# Patient Record
Sex: Female | Born: 1970 | Race: Black or African American | Hispanic: No | Marital: Married | Smoking: Never smoker
Health system: Southern US, Community
[De-identification: ages and names within clinical notes are randomized; demographics above are authoritative.]

## PROBLEM LIST (undated history)

## (undated) DIAGNOSIS — N200 Calculus of kidney: Secondary | ICD-10-CM

## (undated) DIAGNOSIS — D219 Benign neoplasm of connective and other soft tissue, unspecified: Secondary | ICD-10-CM

## (undated) HISTORY — DX: Calculus of kidney: N20.0

## (undated) HISTORY — DX: Benign neoplasm of connective and other soft tissue, unspecified: D21.9

---

## 1994-05-28 HISTORY — PX: APPENDECTOMY: SHX54

## 2002-05-28 HISTORY — PX: MYOMECTOMY ABDOMINAL APPROACH: SUR870

## 2014-11-01 ENCOUNTER — Ambulatory Visit (INDEPENDENT_AMBULATORY_CARE_PROVIDER_SITE_OTHER): Payer: PRIVATE HEALTH INSURANCE | Admitting: Family Medicine

## 2014-11-01 ENCOUNTER — Encounter: Payer: Self-pay | Admitting: Family Medicine

## 2014-11-01 VITALS — BP 128/70 | HR 76 | Temp 98.1°F | Ht 63.0 in | Wt 159.0 lb

## 2014-11-01 DIAGNOSIS — D259 Leiomyoma of uterus, unspecified: Secondary | ICD-10-CM | POA: Diagnosis not present

## 2014-11-01 DIAGNOSIS — K219 Gastro-esophageal reflux disease without esophagitis: Secondary | ICD-10-CM

## 2014-11-01 DIAGNOSIS — M1711 Unilateral primary osteoarthritis, right knee: Secondary | ICD-10-CM | POA: Insufficient documentation

## 2014-11-01 MED ORDER — RANITIDINE HCL 150 MG PO TABS: 150.0000 mg | ORAL_TABLET | Freq: Two times a day (BID) | ORAL | Status: DC | PRN

## 2014-11-01 NOTE — Patient Instructions (Signed)
Nice to meet you today. I put in a referral to gynecology for your fibroids. Someone will call you about this appointment when it is available.  For your belly pain, I think this is related to reflux. I sent a prescription for Zantac to your pharmacy. You can use this twice a day when necessary for heartburn. If it is not getting better please let me know me can have you take it more often.  Walk more often and take breaks on long car trips for your knee pain.  Try tylenol first for pain, but taking motrin every once in a while wont hurt.  Come back to see me for a wellness visit and pap smear when it is convenient for you.  Take care, Dr. Jacinto Reap

## 2014-11-01 NOTE — Progress Notes (Signed)
   Subjective:   Whitney Johns is a 44 y.o. female with a history of GERD, uterine fibroids s/p myomectomy here for establishing care.  Epigastric pain:  - Patient reports intermittent epigastric pain - like a burning sensation - occasionally radiates up her esophagus or to her back - cannot distinguish a pattern of exacerbating factors - reports that she was told that she may have ulcers when she was younger in [place], but it was not looked at, just started on antacid - Reports that last year she had 2 wk course of omeprazole that helped pain - denies N/V/melena/BRBPR  S/p myomectomy, fibroids - said fibroids had grown again when she was pregnant in 2010 - wasn't able to get pregnant after that child - periods are heavy, feels weak and getting heavier - periods are irregular - cycles vary from 24 to 32 day cycle, but used to be regular - would like a referral to Gyn to discuss options  R knee pain:  - reports intermittent R knee pain and stiffness when driving longer distances - walking helps relieve symptoms - this has been present intermittently for many years  Plans to make f/u appt for wellness visit and pap smear  Review of Systems:  Per HPI. All other systems reviewed and are negative.   PMH, PSH, Medications, Allergies, and FmHx reviewed and updated in EMR.  Social History: never smoker  Objective:  BP 128/70 mmHg  Pulse 76  Temp(Src) 98.1 F (36.7 C) (Oral)  Ht 5\' 3"  (1.6 m)  Wt 159 lb (72.122 kg)  BMI 28.17 kg/m2  Gen:  44 y.o. female in NAD HEENT: NCAT, MMM, EOMI, PERRL, anicteric sclerae CV: RRR, no MRG Resp: Non-labored, CTAB, no wheezes noted Abd: Soft, NTND, BS present, no guarding or organomegaly, can palpate fibroids in lower abd (~15 week uterus size) Ext: WWP, no edema MSK: R knee: Full ROM, strength intact, no TTP, mild crepitus Neuro: Alert and oriented, speech normal   Assessment:     Whitney Johns is a 44 y.o. female here for  establishing care, GERD, fibroids, R knee pain.    Plan:     See problem list for problem-specific plans.   Whitney Crews, MD PGY-1,  [facility] 11/01/2014  4:07 PM

## 2014-11-01 NOTE — Assessment & Plan Note (Addendum)
Constellation of symptoms sounds like GERD  prescription given for Zantac 150 mg twice a day when necessary  For heartburn  if this is not consistently help can consider scheduled twice daily Zantac or PPI therapy   if worsening consider H. Pylori testing or GI referral

## 2014-11-01 NOTE — Assessment & Plan Note (Signed)
Will refer to gynecology for discussion of options Advised patient that she may need imaging to further evaluate fibroids as it'll be difficult to get imaging from [place]  Will leave decision about imaging up to gynecology

## 2014-11-01 NOTE — Assessment & Plan Note (Signed)
Suspect knee pain and stiffness when held in one place for a long time w crepitus on exam is consistent with mild osteoarthritis of the right knee  advised patient  To take breaks when she is driving long distances and walk around to relieve the stiffness and pain  Tylenol as needed for pain  Try not to take a lot of NSAIDs given GERD symptoms

## 2014-11-02 ENCOUNTER — Encounter: Payer: Self-pay | Admitting: Obstetrics & Gynecology

## 2014-11-26 ENCOUNTER — Ambulatory Visit (INDEPENDENT_AMBULATORY_CARE_PROVIDER_SITE_OTHER): Payer: PRIVATE HEALTH INSURANCE | Admitting: Obstetrics & Gynecology

## 2014-11-26 ENCOUNTER — Encounter: Payer: Self-pay | Admitting: Obstetrics & Gynecology

## 2014-11-26 VITALS — BP 118/70 | HR 74 | Temp 99.0°F | Ht 63.0 in | Wt 156.9 lb

## 2014-11-26 DIAGNOSIS — Z1239 Encounter for other screening for malignant neoplasm of breast: Secondary | ICD-10-CM

## 2014-11-26 DIAGNOSIS — N921 Excessive and frequent menstruation with irregular cycle: Secondary | ICD-10-CM | POA: Diagnosis not present

## 2014-11-26 DIAGNOSIS — D259 Leiomyoma of uterus, unspecified: Secondary | ICD-10-CM | POA: Diagnosis not present

## 2014-11-26 DIAGNOSIS — Z01419 Encounter for gynecological examination (general) (routine) without abnormal findings: Secondary | ICD-10-CM

## 2014-11-26 DIAGNOSIS — Z3202 Encounter for pregnancy test, result negative: Secondary | ICD-10-CM

## 2014-11-26 DIAGNOSIS — Z1151 Encounter for screening for human papillomavirus (HPV): Secondary | ICD-10-CM | POA: Diagnosis not present

## 2014-11-26 DIAGNOSIS — Z124 Encounter for screening for malignant neoplasm of cervix: Secondary | ICD-10-CM | POA: Diagnosis not present

## 2014-11-26 LAB — CBC
HCT: 34.6 % — ABNORMAL LOW (ref 36.0–46.0)
HEMOGLOBIN: 10.9 g/dL — AB (ref 12.0–15.0)
MCH: 22.7 pg — AB (ref 26.0–34.0)
MCHC: 31.5 g/dL (ref 30.0–36.0)
MCV: 71.9 fL — ABNORMAL LOW (ref 78.0–100.0)
MPV: 9.1 fL (ref 8.6–12.4)
Platelets: 320 10*3/uL (ref 150–400)
RBC: 4.81 MIL/uL (ref 3.87–5.11)
RDW: 16.2 % — AB (ref 11.5–15.5)
WBC: 4.9 10*3/uL (ref 4.0–10.5)

## 2014-11-26 LAB — POCT PREGNANCY, URINE: PREG TEST UR: NEGATIVE

## 2014-11-26 MED ORDER — EST ESTROGENS-METHYLTEST 0.625-1.25 MG PO TABS: 1.0000 | ORAL_TABLET | Freq: Every day | ORAL | Status: DC

## 2014-11-26 MED ORDER — MEDROXYPROGESTERONE ACETATE 10 MG PO TABS: 20.0000 mg | ORAL_TABLET | Freq: Every day | ORAL | Status: DC

## 2014-11-26 NOTE — Progress Notes (Signed)
C/o periods now heavy for more than a year , and she feels weak.

## 2014-11-26 NOTE — Progress Notes (Signed)
Patient ID: Whitney Johns, female   DOB: April 09, 1971, 44 y.o.   MRN: [id number]  Chief Complaint  Patient presents with  . Fibroids  C/o periods now heavy for more than a year , and she feels weak.   HPI Whitney Johns is a 44 y.o. female.  [id number] Patient's last menstrual period was 11/06/2014. H/O fibroid and myomectomy 12 years ago in [place], [place] 2010 in [place]. Increasing menometrorrhagia  HPI  Past Medical History  Diagnosis Date  . Fibroids     Past Surgical History  Procedure Laterality Date  . Appendectomy  1996  . Cesarean section  2010  . Myomectomy abdominal approach  2004    Family History  Problem Relation Age of Onset  . Cancer Father     prostate  . Cancer Paternal Grandfather     trachea    Social History History  Substance Use Topics  . Smoking status: Never Smoker   . Smokeless tobacco: Never Used  . Alcohol Use: No     Comment: once per month (1 glass of wine)    No Known Allergies  Current Outpatient Prescriptions  Medication Sig Dispense Refill  . ranitidine (ZANTAC) 150 MG tablet Take 1 tablet (150 mg total) by mouth 2 (two) times daily as needed for heartburn. 60 tablet 2  . medroxyPROGESTERone (PROVERA) 10 MG tablet Take 2 tablets (20 mg total) by mouth daily. 30 tablet 2   No current facility-administered medications for this visit.    Review of Systems Review of Systems  Constitutional: Positive for fatigue.  Respiratory: Negative.   Cardiovascular: Negative.   Gastrointestinal: Negative.   Genitourinary: Positive for menstrual problem. Negative for vaginal bleeding.    Blood pressure 118/70, pulse 74, temperature 99 F (37.2 C), height 5\' 3"  (1.6 m), weight 156 lb 14.4 oz (71.169 kg), last menstrual period 11/06/2014.  Physical Exam Physical Exam  Constitutional: She is oriented to person, place, and time. She appears well-developed. No distress.  Pulmonary/Chest: Effort normal. No respiratory distress.  Abdominal: Soft. She  exhibits no mass.  Genitourinary: Vagina normal. No vaginal discharge found.  Cervix nl. Pap done, uterus 10 week size firm no masses  Neurological: She is alert and oriented to person, place, and time.  Skin: Skin is warm and dry. No pallor.  Psychiatric: She has a normal mood and affect. Her behavior is normal.    Data Reviewed Office note [place]  Assessment    Menometrorrhagia Fibroid uterus  Discussed fertility and likelihood of pregnancy     Plan    US pelvis ordered Pap done today Provera 20 mg daily CBC RTC 4 weeks        Jabbar Palmero 11/26/2014, 9:54 AM

## 2014-11-26 NOTE — Patient Instructions (Addendum)

## 2014-11-30 LAB — CYTOLOGY - PAP

## 2014-12-03 ENCOUNTER — Ambulatory Visit (HOSPITAL_COMMUNITY): Payer: PRIVATE HEALTH INSURANCE

## 2014-12-10 ENCOUNTER — Ambulatory Visit (HOSPITAL_COMMUNITY)
Admission: RE | Admit: 2014-12-10 | Discharge: 2014-12-10 | Disposition: A | Discharge status: Home / Self Care | Payer: PRIVATE HEALTH INSURANCE | Source: Ambulatory Visit | Attending: Obstetrics & Gynecology | Admitting: Obstetrics & Gynecology

## 2014-12-10 DIAGNOSIS — N852 Hypertrophy of uterus: Secondary | ICD-10-CM | POA: Diagnosis not present

## 2014-12-10 DIAGNOSIS — N921 Excessive and frequent menstruation with irregular cycle: Secondary | ICD-10-CM | POA: Diagnosis not present

## 2014-12-10 DIAGNOSIS — D259 Leiomyoma of uterus, unspecified: Secondary | ICD-10-CM

## 2014-12-10 DIAGNOSIS — Z1231 Encounter for screening mammogram for malignant neoplasm of breast: Secondary | ICD-10-CM | POA: Insufficient documentation

## 2014-12-10 DIAGNOSIS — Z1239 Encounter for other screening for malignant neoplasm of breast: Secondary | ICD-10-CM

## 2014-12-24 ENCOUNTER — Ambulatory Visit (INDEPENDENT_AMBULATORY_CARE_PROVIDER_SITE_OTHER): Payer: PRIVATE HEALTH INSURANCE | Admitting: Obstetrics & Gynecology

## 2014-12-24 ENCOUNTER — Encounter: Payer: Self-pay | Admitting: Obstetrics & Gynecology

## 2014-12-24 VITALS — BP 132/78 | HR 72 | Temp 98.0°F | Ht 63.0 in | Wt 158.6 lb

## 2014-12-24 DIAGNOSIS — D259 Leiomyoma of uterus, unspecified: Secondary | ICD-10-CM | POA: Diagnosis not present

## 2014-12-24 NOTE — Patient Instructions (Signed)

## 2014-12-24 NOTE — Progress Notes (Signed)
Subjective:     Patient ID: Whitney Johns, female   DOB: [birth year], 44 y.o.   MRN: [id number]  HPI Whitney Johns is a 44 y.o. AA female G2P1011 who presents to the clinic for a follow up of pelvic ultrasound for uterine fibroids. She has had no changes since last visit 11/26/2014. LMP 12/01/2014. Periods are heavy, especially in the first couple days. Changes tampons q2h and pads q3h. Has occasional 6/10 crampy intermittent pelvic pain with periods. Patient still desires future pregnancy and would like to discuss options.   Ultrasound Report: FINDINGS: Uterus measurements: At least 16.5 x 9.3 x 10.4 cm. Multiple fibroids are identified and difficult to differentiate. However, following measurements are made:  Left partially calcified sub serosal fibroid 5.3 x 5.0 x 4.2 cm.  Right fundal subserosal fibroid 8.3 x 7.6 x 7.4 cm.  Right sub serosal uterine fibroid 4.6 x 4.4 x 3.8 cm.  Endometrium Thickness: Difficult to visualize but estimated to be 2.1 mm. Upper uterine endometrium is obscured or displaced by fibroids. No focal abnormality visualized.  Right ovary measurements: 3.1 x 2.8 x 1.9 cm. Normal appearance/no adnexal mass.  Left ovary measurements: The ovary is not visualized, either absent or obscured. No adnexal masses identified.  Other findings: No free fluid.  IMPRESSION: 1. Enlarged uterus containing numerous fibroids as described. 2. The visualized portion of the endometrium is normal in appearance. If bleeding remains unresponsive to hormonal or medical therapy, sonohysterogram should be considered for focal lesion work-up. (Ref: Radiological Reasoning: Algorithmic Workup of Abnormal Vaginal Bleeding with Endovaginal Sonography and Sonohysterography. AJR 2008; 086:P61-95)  Review of Systems  Constitutional: Positive for fatigue (especially when walking up stairs, improves with implementing more exercise throughout the day). Negative for fever, chills and  unexpected weight change.  HENT: Negative for congestion.   Eyes: Negative for visual disturbance.  Respiratory: Negative for apnea, cough, chest tightness and shortness of breath.   Cardiovascular: Negative for chest pain, palpitations and leg swelling.  Gastrointestinal: Negative for nausea, vomiting, diarrhea, constipation and blood in stool. Abdominal pain: mild crampy pelvic pain.  Endocrine: Negative for polydipsia and polyuria.  Genitourinary: Positive for menstrual problem (heavy periods) and pelvic pain (mild crampy pelvic pain ). Negative for dysuria, urgency, frequency, hematuria, flank pain, decreased urine volume, vaginal bleeding, vaginal discharge, enuresis, difficulty urinating, vaginal pain and dyspareunia.  Musculoskeletal: Negative for myalgias and back pain.  Skin: Negative for pallor and rash.  Neurological: Positive for headaches (occasionally, relieved with tylenol). Negative for dizziness, syncope and weakness.  Hematological: Does not bruise/bleed easily.  Psychiatric/Behavioral: Positive for agitation (occasionally gets irritated).       Objective:   Physical Exam  Constitutional: She is oriented to person, place, and time. She appears well-developed and well-nourished. No distress.  Eyes: Conjunctivae are normal. No scleral icterus.  Neck: Neck supple.  Cardiovascular: Normal rate, regular rhythm and normal heart sounds.   Pulmonary/Chest: Effort normal and breath sounds normal.  Abdominal: Soft. Bowel sounds are normal. She exhibits no distension. There is tenderness (mild tenderness in lower quadrants).  Musculoskeletal: She exhibits no edema or tenderness.  Lymphadenopathy:    She has no cervical adenopathy.  Neurological: She is alert and oriented to person, place, and time.  Skin: Skin is warm and dry. No rash noted. She is not diaphoretic. No erythema. No pallor.  Psychiatric: She has a normal mood and affect.       Assessment:     Fibroid uterus,  recurrent  Menometrorrhagia Discussed fertility  and likelihood of pregnancy    Plan:     Fibroids are stable for now. CBC stable- Hg was 10.9. Surgical removal is not recommended due to recurrence of fibroids after past surgical removal and risk of hysterectomy as patient desires pregnancy.  Referred to [facility] for discussion of possible fertility options  RTC prn

## 2015-02-03 ENCOUNTER — Other Ambulatory Visit: Payer: Self-pay | Admitting: Family Medicine

## 2017-02-12 IMAGING — US US PELVIS COMPLETE
1 series · 13 of 25 positions shown · non-contrast
Comparison: None

CLINICAL DATA: Menometrorrhagia. History of myomectomy and
C-section. LMP 12/01/2014.

EXAM:
TRANSABDOMINAL AND TRANSVAGINAL ULTRASOUND OF PELVIS
TECHNIQUE: Both transabdominal and transvaginal ultrasound examinations of the
pelvis were performed. Transabdominal technique was performed for
global imaging of the pelvis including uterus, ovaries, adnexal
regions, and pelvic cul-de-sac. It was necessary to proceed with
endovaginal exam following the transabdominal exam to visualize the
endometrium and ovaries.

[Series 1: us non-ob tv/pel · 13 of 40 slices shown]
[im 1/40]
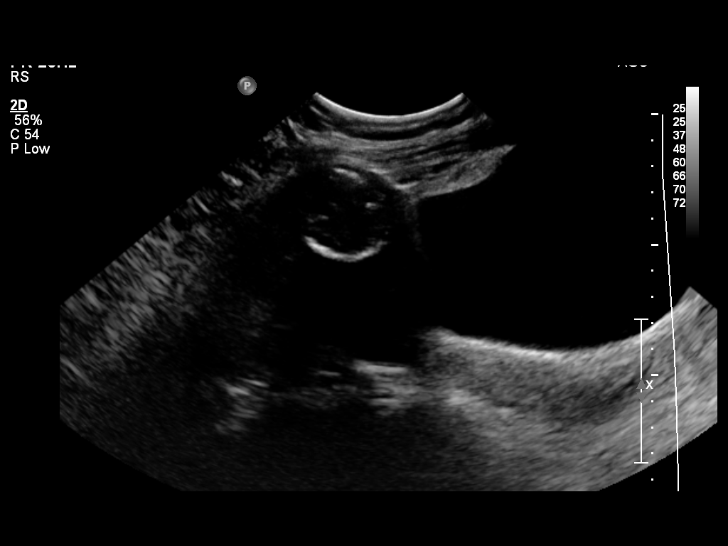
[im 4/40]
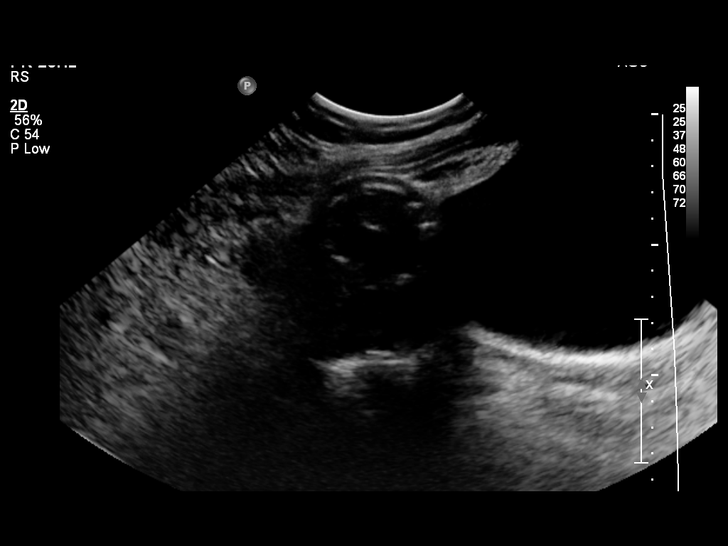
[im 7/40]
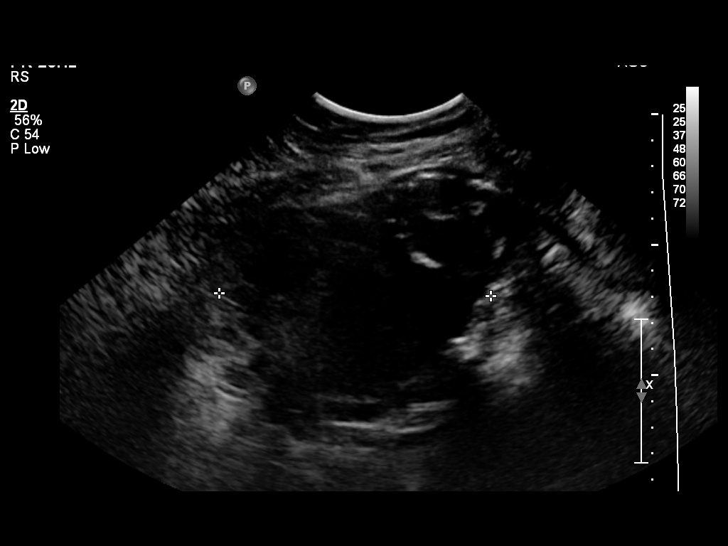
[im 10/40]
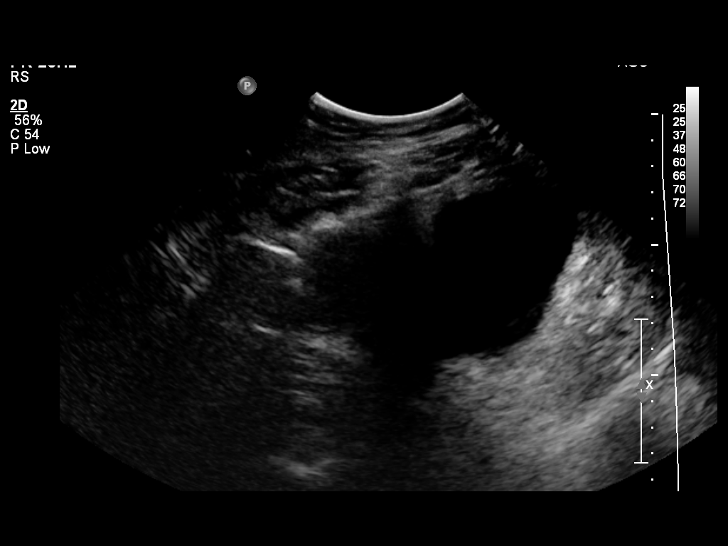
[im 14/40]
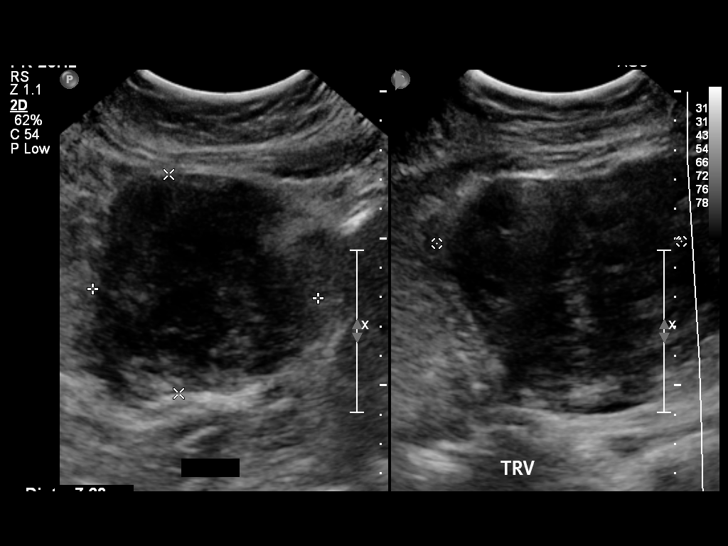
[im 17/40]
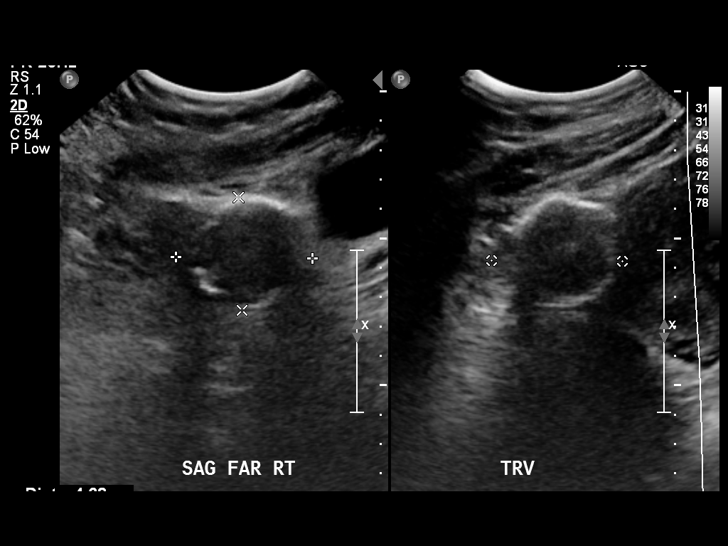
[im 20/40]
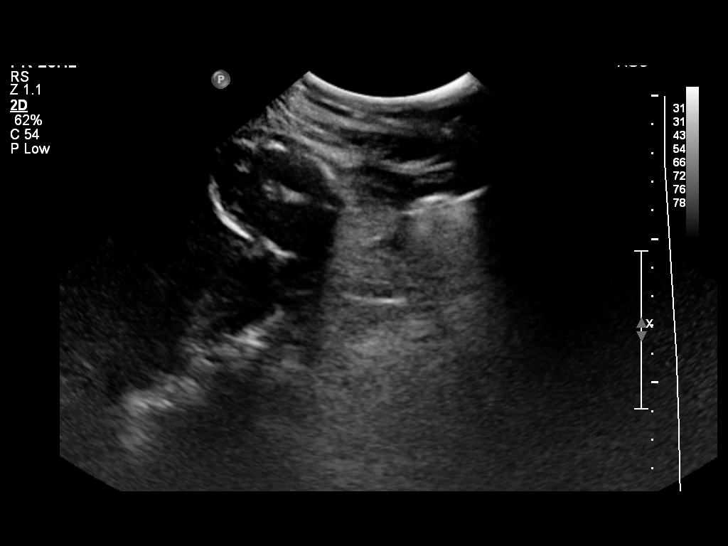
[im 23/40]
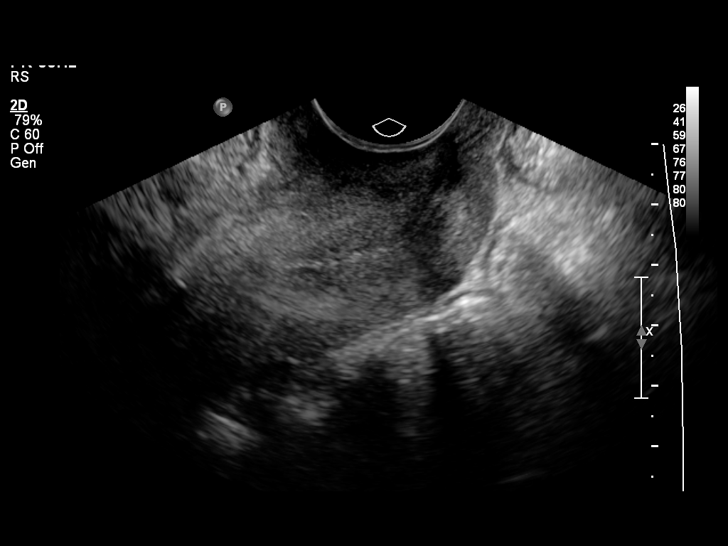
[im 27/40]
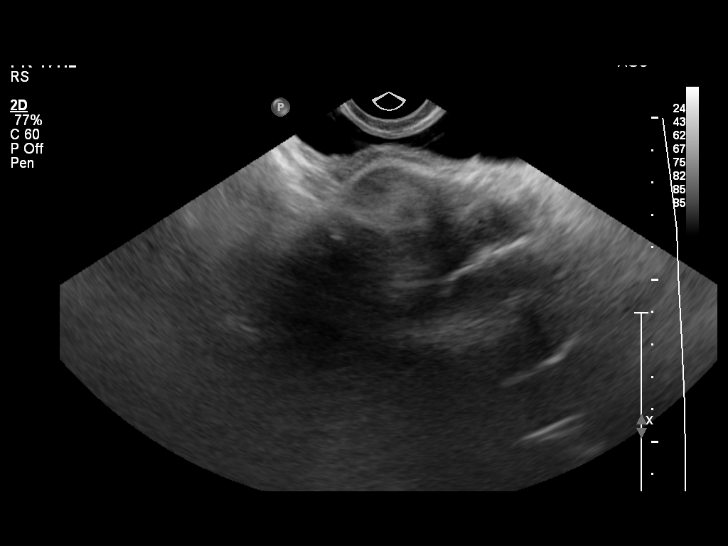
[im 30/40]
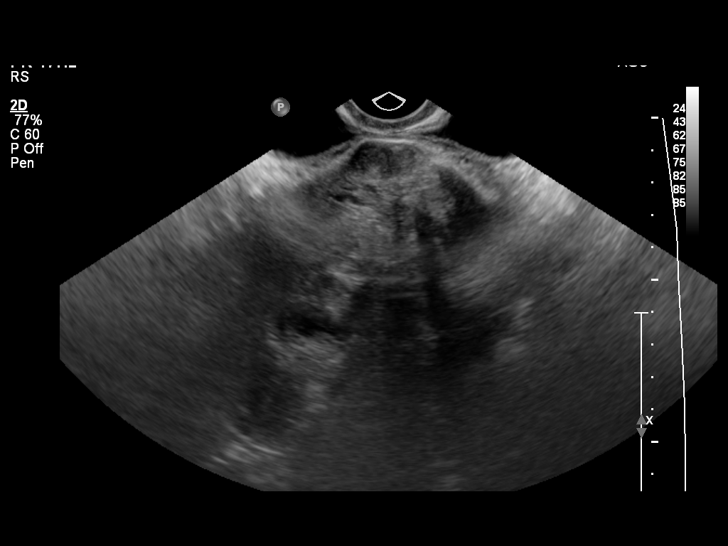
[im 33/40]
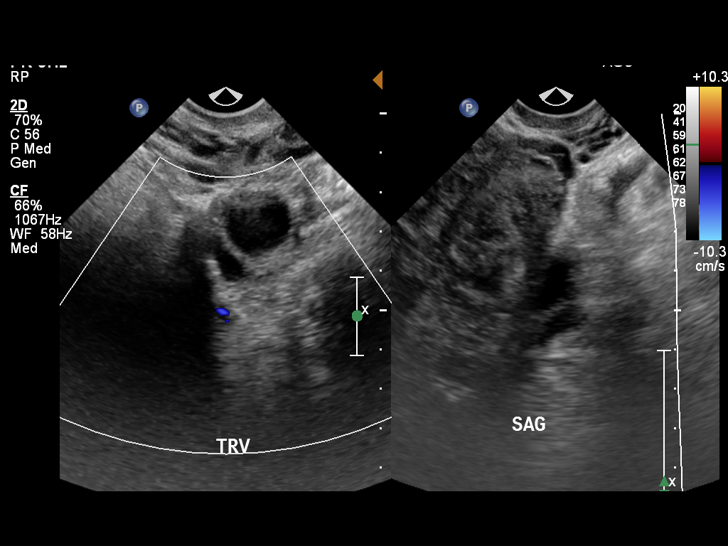
[im 36/40]
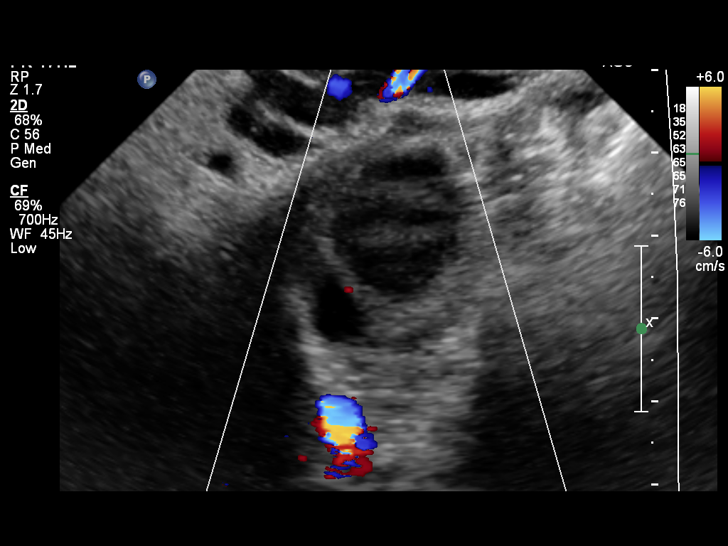
[im 40/40]
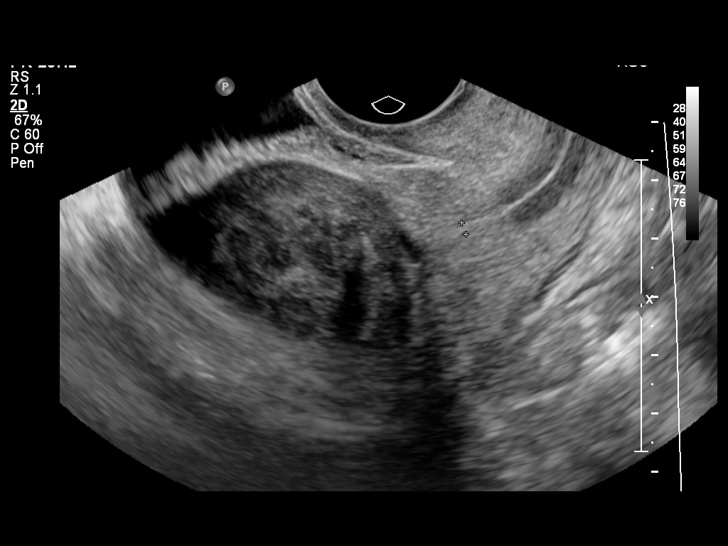

[13 of 25 positions shown; findings below may reference images not displayed]

FINDINGS: Uterus

Measurements: At least 16.5 x 9.3 x 10.4 cm.. Multiple fibroids are
identified and difficult to differentiate. However, following
measurements are made:

Left partially calcified sub serosal fibroid 5.3 x 5.0 x 4.2 cm.

Right fundal subserosal fibroid 8.3 x 7.6 x 7.4 cm.

Right sub serosal uterine fibroid 4.6 x 4.4 x 3.8 cm.

Endometrium

Thickness: Difficult to visualize but estimated to be 2.1 mm. Upper
uterine endometrium is obscured or displaced by fibroids.. No focal
abnormality visualized.

Right ovary

Measurements: 3.1 x 2.8 x 1.9 cm. Normal appearance/no adnexal mass.

Left ovary

Measurements: The ovary is not visualized, either absent or obscured
. No adnexal masses identified.

Other findings

No free fluid.
IMPRESSION: 1. Enlarged uterus containing numerous fibroids as described.
2. The visualized portion of the endometrium is normal in
appearance. If bleeding remains unresponsive to hormonal or medical
therapy, sonohysterogram should be considered for focal lesion
work-up. (Ref: Radiological Reasoning: Algorithmic Workup of
Abnormal Vaginal Bleeding with Endovaginal Sonography and
Sonohysterography. AJR 1119; 191:S68-73)

## 2024-03-09 ENCOUNTER — Encounter: Payer: Self-pay | Admitting: Family Medicine

## 2024-03-09 ENCOUNTER — Ambulatory Visit (INDEPENDENT_AMBULATORY_CARE_PROVIDER_SITE_OTHER): Admitting: Family Medicine

## 2024-03-09 VITALS — BP 133/85 | HR 86 | Ht 63.0 in | Wt 179.0 lb

## 2024-03-09 DIAGNOSIS — H268 Other specified cataract: Secondary | ICD-10-CM

## 2024-03-09 DIAGNOSIS — Z0000 Encounter for general adult medical examination without abnormal findings: Secondary | ICD-10-CM | POA: Diagnosis not present

## 2024-03-09 DIAGNOSIS — M545 Low back pain, unspecified: Secondary | ICD-10-CM

## 2024-03-09 DIAGNOSIS — R29898 Other symptoms and signs involving the musculoskeletal system: Secondary | ICD-10-CM

## 2024-03-09 DIAGNOSIS — G8929 Other chronic pain: Secondary | ICD-10-CM

## 2024-03-09 DIAGNOSIS — M170 Bilateral primary osteoarthritis of knee: Secondary | ICD-10-CM | POA: Diagnosis not present

## 2024-03-09 LAB — POCT GLYCOSYLATED HEMOGLOBIN (HGB A1C): Hemoglobin A1C: 5.3 % (ref 4.0–5.6)

## 2024-03-09 MED ORDER — DICLOFENAC SODIUM 1 % EX GEL: 2.0000 g | Freq: Four times a day (QID) | CUTANEOUS | 1 refills | Status: AC | PRN

## 2024-03-09 NOTE — Progress Notes (Unsigned)
    SUBJECTIVE:   Chief compliant/HPI: annual examination  Whitney Johns is a 52 y.o. who presents today for an annual exam.   Jaw popping   Back pain - months, improves with tylenol   R knee arthritis - better after PT  History tabs reviewed and updated ***.   Review of systems form reviewed and notable for ***.    PMH: fibroids s/p myomectomy, no postmenopasual without bleeding  Ulcers PUD? GERD Cataracts   Surg: appendix removal, c section   FMH: prostate cx father  OBJECTIVE:   BP 133/85   Pulse 86   Ht 5' 3 (1.6 m)   Wt 179 lb (81.2 kg)   SpO2 98%   BMI 31.71 kg/m   General: Well-appearing. Resting comfortably in room. CV: Normal S1/S2. No extra heart sounds. Warm and well-perfused. Pulm: Breathing comfortably on room air. CTAB. No increased WOB. Abd: Soft, non-tender, non-distended. Skin:  Warm, dry. MSK: NO spinal bony tenderness. Normal gait. Normal knee ROM, nontender to palaption. NO mandibular tenderness, normal speech.  Psych: Pleasant and appropriate.    ASSESSMENT/PLAN:   Assessment & Plan Healthcare maintenance  Primary osteoarthritis of both knees  Other cataract of both eyes  Annual Examination  See AVS for age appropriate recommendations.  PHQ score ***, reviewed and discussed.  Blood pressure value is *** goal, discussed.   Considered the following screening exams based upon USPSTF recommendations: Diabetes screening: {FMCANNUALORDERED:33692} HIV testing:{FMCANNUALORDERED:33692} Hepatitis C: {FMCANNUALORDERED:33692} Hepatitis B:{FMCANNUALORDERED:33692} Syphilis if at high risk: {FMCANNUALORDERED:33692} GC/CT {GC/CT screening :23818} Lipid panel (nonfasting or fasting) discussed based upon AHA recommendations and {FMCLIPID:33694}.  Consider repeat every 4-6 years.  Reviewed risk factors for latent tuberculosis and {not indicated/requested/declined:14582}  Cancer Screening Discussion  Lung cancer  screening:{FMCLUNGCANCERSCREENIN:33695}.  See documentation below regarding indications/risks/benefits.  Colorectal cancer screening: {crcscreen:23821::discussed, colonoscopy ordered}.  PSA discussed and after engaging in discussion of possible risks, benefits and complications of screening. PSA: {not indicated/requested/declined:14582} Vaccinations ***.   Follow up in 1 *** year or sooner if indicated.  MyChart Activation: {MYCHARTLIST:32522}   Follow up in 1 year or sooner if indicated.  MyChart Activation: {MYCHARTLIST:32522}   Damien Cassis, MD [facility]

## 2024-03-09 NOTE — Patient Instructions (Signed)
 Thank you for visiting clinic today and allowing us  to participate in your care!  We are checking some routine lab work today and will let you know of the results. We ordered screening mammogram and colonoscopy.   For your knee arthritis, we discussed physical therapy and voltaren gel as needed. For your back pain, try lidocaine patches and voltaren gel as needed.   We also referred you to opthalmology for your cataracts.   Please schedule an appointment at your earliest convenience for your pap smear.   Reach out any time with any questions or concerns you may have - we are here for you!  Damien Cassis, MD [facility] [phone]

## 2024-03-10 LAB — LIPID PANEL
Chol/HDL Ratio: 7 ratio — ABNORMAL HIGH (ref 0.0–4.4)
Cholesterol, Total: 296 mg/dL — ABNORMAL HIGH (ref 100–199)
HDL: 42 mg/dL (ref >39)
LDL Chol Calc (NIH): 211 mg/dL — ABNORMAL HIGH (ref 0–99)
Triglycerides: 220 mg/dL — ABNORMAL HIGH (ref 0–149)
VLDL Cholesterol Cal: 43 mg/dL — ABNORMAL HIGH (ref 5–40)

## 2024-03-10 LAB — BASIC METABOLIC PANEL WITH GFR
BUN/Creatinine Ratio: 18 (ref 9–23)
BUN: 12 mg/dL (ref 6–24)
CO2: 24 mmol/L (ref 20–29)
Calcium: 9.2 mg/dL (ref 8.7–10.2)
Chloride: 104 mmol/L (ref 96–106)
Creatinine, Ser: 0.66 mg/dL (ref 0.57–1.00)
Glucose: 77 mg/dL (ref 70–99)
Potassium: 4 mmol/L (ref 3.5–5.2)
Sodium: 141 mmol/L (ref 134–144)
eGFR: 104 mL/min/1.73 (ref >59)

## 2024-03-10 LAB — HEPATITIS C ANTIBODY: Hep C Virus Ab: NONREACTIVE

## 2024-03-10 LAB — HIV ANTIBODY (ROUTINE TESTING W REFLEX): HIV Screen 4th Generation wRfx: NONREACTIVE

## 2024-03-11 ENCOUNTER — Ambulatory Visit: Payer: Self-pay | Admitting: Family Medicine

## 2024-03-11 ENCOUNTER — Other Ambulatory Visit (HOSPITAL_COMMUNITY): Payer: Self-pay

## 2024-03-11 ENCOUNTER — Telehealth: Payer: Self-pay

## 2024-03-11 DIAGNOSIS — G8929 Other chronic pain: Secondary | ICD-10-CM | POA: Insufficient documentation

## 2024-03-11 DIAGNOSIS — H269 Unspecified cataract: Secondary | ICD-10-CM | POA: Insufficient documentation

## 2024-03-11 NOTE — Assessment & Plan Note (Signed)
 Unlikely acute bony changes given benign exam. Suspect strain 2/2 posture or overuse from work. Discussed lidocaine patch and voltaren gel as needed.

## 2024-03-11 NOTE — Telephone Encounter (Signed)
 Prior authorization submitted for DICLOFENAC GEL 1% to [facility] via Lat[employer].   Key: [id number]

## 2024-03-11 NOTE — Assessment & Plan Note (Signed)
 Referral to opthalmology placed for further assessment.

## 2024-03-12 ENCOUNTER — Other Ambulatory Visit: Payer: Self-pay | Admitting: Family Medicine

## 2024-03-12 MED ORDER — ATORVASTATIN CALCIUM 40 MG PO TABS: 40.0000 mg | ORAL_TABLET | Freq: Every day | ORAL | 3 refills | Status: AC

## 2024-03-12 NOTE — Progress Notes (Signed)
Made in error. Please disregard.

## 2024-03-12 NOTE — Telephone Encounter (Signed)
 Pharmacy Patient Advocate Encounter  Received notification from [facility] that Prior Authorization for DICLOFENAC GEL 1% has been DENIED.  Full denial letter will be uploaded to the media tab. See denial reason below.  Denied because coverage applies only if oral NSAIDs cannot be used   Brand Voltaren Gel available OTC  PA #/Case ID/Reference #: [id number]

## 2024-03-26 NOTE — Progress Notes (Signed)
    SUBJECTIVE:   CHIEF COMPLAINT / HPI:   Pap smear -Presenting today for routine pap smear -No hx abnormal pap -Amenable to routine STI screening  -No recent vaginal discharge changes or other sx   HLD -Has not yet started to take statin -No known family hx of high cholesterol -Does regularly consume fried foods  PERTINENT  PMH / PSH: Uterine fibroid  OBJECTIVE:   BP (!) 143/92   Pulse 90   Ht 5' 3 (1.6 m)   Wt 180 lb 6.4 oz (81.8 kg)   SpO2 99%   BMI 31.96 kg/m   General: Well-appearing. Resting comfortably in room. CV: Normal S1/S2. No extra heart sounds. Warm and well-perfused. Pulm: Breathing comfortably on room air. CTAB. No increased WOB. Skin:  Warm, dry. Psych: Pleasant and appropriate.  GU: Normal appearing external genitalia. Normal appearing cervix. No discharge. Decreased vaginal rugae as expected for age. GU exam chaperoned by CMA.    ASSESSMENT/PLAN:   Assessment & Plan Healthcare maintenance Completed routine pap smear today. Routine G/C and Trich testing collected. Recently negative HIV and RPR without new sexual encounter since.  Other hyperlipidemia Discussed recent elevated lipid panel. Discussed importance of cholesterol control. Discussed plan for atorvastatin and healthy lifestyle habits Discussed and placed referral to Lipid panel given LDL >200.  Elevated BP without diagnosis of hypertension BP elevated on repeat. Patient reports no HA, VC, CP, SOB. Discussed healthy lifestyle habits.  Plan to return to clinic in 1-2 weeks for BP follow up. Emergent precautions discussed. Encounter for immunization Received annual flu vaccine and Tdap today. Received first dose of Hep B vaccine series, to return for remaining doses.     Damien Cassis, MD [facility]

## 2024-03-27 ENCOUNTER — Other Ambulatory Visit (HOSPITAL_COMMUNITY)
Admission: RE | Admit: 2024-03-27 | Discharge: 2024-03-27 | Disposition: A | Discharge status: Home / Self Care | Source: Ambulatory Visit | Attending: Family Medicine | Admitting: Family Medicine

## 2024-03-27 ENCOUNTER — Ambulatory Visit: Admitting: Family Medicine

## 2024-03-27 ENCOUNTER — Encounter: Payer: Self-pay | Admitting: Family Medicine

## 2024-03-27 VITALS — BP 143/92 | HR 90 | Ht 63.0 in | Wt 180.4 lb

## 2024-03-27 DIAGNOSIS — Z0000 Encounter for general adult medical examination without abnormal findings: Secondary | ICD-10-CM | POA: Insufficient documentation

## 2024-03-27 DIAGNOSIS — E7849 Other hyperlipidemia: Secondary | ICD-10-CM

## 2024-03-27 DIAGNOSIS — Z23 Encounter for immunization: Secondary | ICD-10-CM | POA: Diagnosis not present

## 2024-03-27 DIAGNOSIS — R030 Elevated blood-pressure reading, without diagnosis of hypertension: Secondary | ICD-10-CM | POA: Diagnosis not present

## 2024-03-27 NOTE — Patient Instructions (Addendum)
 Thank you for visiting clinic today and allowing us  to participate in your care!  We collected your pap smear and routine testing today. We will let you know the results as they return.   Your cholesterol levels are high. Please take the atorvastatin medications daily. We referred you to the Cardiology team for further evaluation as well.   For your mammogram, call [phone] to schedule an appointment.   For your colonoscopy, call [phone]  to schedule your appointment.   You received your Flu, Tdap, and Hep B vaccines today. Please return in 1 month for your next Hep B vaccine. This can be a nurse visit.   Please schedule an appointment in 1-2 weeks for blood pressure follow up.   Reach out any time with any questions or concerns you may have - we are here for you!  Damien Cassis, MD [facility] [phone]

## 2024-03-30 LAB — CERVICOVAGINAL ANCILLARY ONLY
Bacterial Vaginitis (gardnerella): NEGATIVE
Candida Glabrata: NEGATIVE
Candida Vaginitis: NEGATIVE
Chlamydia: NEGATIVE
Comment: NEGATIVE
Comment: NEGATIVE
Comment: NEGATIVE
Comment: NEGATIVE
Comment: NEGATIVE
Comment: NORMAL
Neisseria Gonorrhea: NEGATIVE
Trichomonas: NEGATIVE

## 2024-03-31 LAB — CYTOLOGY - PAP
Comment: NEGATIVE
Diagnosis: NEGATIVE
High risk HPV: NEGATIVE

## 2024-04-02 ENCOUNTER — Encounter: Payer: Self-pay | Admitting: Gastroenterology

## 2024-04-02 ENCOUNTER — Ambulatory Visit: Payer: Self-pay | Admitting: Family Medicine

## 2024-04-07 ENCOUNTER — Encounter (HOSPITAL_BASED_OUTPATIENT_CLINIC_OR_DEPARTMENT_OTHER): Payer: Self-pay | Admitting: Internal Medicine

## 2024-04-07 ENCOUNTER — Telehealth (HOSPITAL_BASED_OUTPATIENT_CLINIC_OR_DEPARTMENT_OTHER): Payer: Self-pay | Admitting: *Deleted

## 2024-04-07 ENCOUNTER — Ambulatory Visit (INDEPENDENT_AMBULATORY_CARE_PROVIDER_SITE_OTHER): Admitting: Internal Medicine

## 2024-04-07 VITALS — BP 134/78 | HR 85 | Ht 63.0 in | Wt 175.8 lb

## 2024-04-07 DIAGNOSIS — E7849 Other hyperlipidemia: Secondary | ICD-10-CM | POA: Diagnosis not present

## 2024-04-07 NOTE — Patient Instructions (Signed)
 Medication Instructions:  Your physician recommends that you continue on your current medications as directed. Please refer to the Current Medication list given to you today.  *If you need a refill on your cardiac medications before your next appointment, please call your pharmacy*  Lab Work: Your physician recommends that you return for lab work in: FASTING LABS IN 3 MONTHS   NMR Lipoprofile and Lp(a)  PLEASE REFER TO INFORMATION GIVEN REGARDING YOUR GENETIC TESTING RESULTS   If you have labs (blood work) drawn today and your tests are completely normal, you will receive your results only by: MyChart Message (if you have MyChart) OR A paper copy in the mail If you have any lab test that is abnormal or we need to change your treatment, we will call you to review the results.  Testing/Procedures: NONE  Follow-Up: At [facility], you and your health needs are our priority.  As part of our continuing mission to provide you with exceptional heart care, our providers are all part of one team.  This team includes your primary Cardiologist (physician) and Advanced Practice Providers or APPs (Physician Assistants and Nurse Practitioners) who all work together to provide you with the care you need, when you need it.  Your next appointment:   AS NEEDED

## 2024-04-07 NOTE — Telephone Encounter (Signed)
 Genetic test for DYSLIPIDEMIA & ASCVD PANEL ordered ([facility]) Cheek swab completed in office Specimen and necessary paperwork mailed. ID: [id number]

## 2024-04-07 NOTE — Progress Notes (Signed)
 LIPID CLINIC CONSULT NOTE  Chief Complaint:  Dyslipidemia  Primary Care Physician: Diona Perkins, MD  Primary Cardiologist:  None  HPI:  Whitney Johns is a 53 y.o. female who is being seen today for the evaluation of dyslipidemia at the request of Donah Laymon PARAS, MD. this is a pleasant 53 year old [place] female who has a history of high cholesterol which was diagnosed in [place] and initially was on medication for it but she stopped the medicine and try to change her diet to see if her cholesterol would improve.  She recently had repeat lipids which show very high cholesterol including total 296, HDL 42, triglycerides 228 LDL 211.  This is suggestive of familial hyperlipidemia.  She is not very aware of her father side of the family but does not note any high cholesterol in her mother early onset heart disease.  She recently was started on atorvastatin 40 mg daily but has only been on the medicine for about 3 weeks.  PMHx:  Past Medical History:  Diagnosis Date   Fibroids     Past Surgical History:  Procedure Laterality Date   APPENDECTOMY  1996   CESAREAN SECTION  2010   MYOMECTOMY ABDOMINAL APPROACH  2004    FAMHx:  Family History  Problem Relation Age of Onset   Cancer Father        prostate   Cancer Paternal Grandfather        trachea    SOCHx:   reports that she has never smoked. She has never used smokeless tobacco. She reports that she does not drink alcohol and does not use drugs.  ALLERGIES:  No Known Allergies  ROS: Pertinent items noted in HPI and remainder of comprehensive ROS otherwise negative.  HOME MEDS: Current Outpatient Medications on File Prior to Visit  Medication Sig Dispense Refill   atorvastatin (LIPITOR) 40 MG tablet Take 1 tablet (40 mg total) by mouth daily. 90 tablet 3   Bioflavonoid Products (BIOFLEX PO) Take by mouth.     diclofenac Sodium (VOLTAREN) 1 % GEL Apply 2 g topically 4 (four) times daily as needed (pain). 150 g 1    Probiotic Product (PROBIOTIC BLEND PO) Take by mouth.     No current facility-administered medications on file prior to visit.    LABS/IMAGING: No results found for this or any previous visit (from the past 48 hours). No results found.  LIPID PANEL:    Component Value Date/Time   CHOL 296 (H) 03/09/2024 1618   TRIG 220 (H) 03/09/2024 1618   HDL 42 03/09/2024 1618   CHOLHDL 7.0 (H) 03/09/2024 1618   LDLCALC 211 (H) 03/09/2024 1618    No results found for: LIPOA   WEIGHTS: Wt Readings from Last 3 Encounters:  04/07/24 175 lb 12.8 oz (79.7 kg)  03/27/24 180 lb 6.4 oz (81.8 kg)  03/09/24 179 lb (81.2 kg)    VITALS: BP 134/78   Pulse 85   Ht 5' 3 (1.6 m)   Wt 175 lb 12.8 oz (79.7 kg)   SpO2 97%   BMI 31.14 kg/m   EXAM: Deferred  EKG: Deferred  ASSESSMENT: Probable familial hyperlipidemia, LDL greater than 190  PLAN: 1.   Ms. Pasqua has a probable familial hyperlipidemia with LDL greater than 190.  She has recently been started on guideline directed high intensity statin therapy.  Unfortunately she is only been on the medicine for about 3 weeks so we will have to wait a little longer before we  repeat her lipids.  At that time I did recommend an NMR and LP(a).  We also discussed genetic testing today.  This would likely be covered by insurance and I think is reasonable, given her younger age to further risk stratify her and for cascade screening of her daughter who was with her in the visit today.  She understands this is a send out test can take several weeks to get back and I will reach out to her with those results.  The maximal cost is $299 if it was not covered by insurance.  Thanks again for the kind referral  Whitney KYM Maxcy, MD, [facility], FNLA, FACP  [place]  [facility]  Medical Director of the Advanced Lipid Disorders &  Cardiovascular Risk Reduction Clinic Diplomate of the American Board of Clinical Lipidology Attending Cardiologist  Direct  Dial: [phone]  Fax: [phone]  Website:  [email or web address] Whitney Johns 04/07/2024, 10:23 AM

## 2024-04-10 ENCOUNTER — Ambulatory Visit (INDEPENDENT_AMBULATORY_CARE_PROVIDER_SITE_OTHER)

## 2024-04-10 ENCOUNTER — Other Ambulatory Visit: Payer: Self-pay

## 2024-04-10 DIAGNOSIS — M25661 Stiffness of right knee, not elsewhere classified: Secondary | ICD-10-CM

## 2024-04-10 DIAGNOSIS — M25562 Pain in left knee: Secondary | ICD-10-CM

## 2024-04-10 DIAGNOSIS — M25561 Pain in right knee: Secondary | ICD-10-CM

## 2024-04-10 DIAGNOSIS — M25662 Stiffness of left knee, not elsewhere classified: Secondary | ICD-10-CM

## 2024-04-10 DIAGNOSIS — G8929 Other chronic pain: Secondary | ICD-10-CM

## 2024-04-10 NOTE — Therapy (Signed)
 OUTPATIENT PHYSICAL THERAPY LOWER EXTREMITY EVALUATION   Patient Name: Whitney Johns MRN: [id number] DOB:Nov 21, 1970, 53 y.o., female Today's Date: 04/10/2024  END OF SESSION:  PT End of Session - 04/10/24 0835     Visit Number 1    Number of Visits 13    Date for Recertification  05/27/24    PT Start Time 0800    PT Stop Time 0832    PT Time Calculation (min) 32 min    Activity Tolerance Patient tolerated treatment well    Behavior During Therapy [facility] for tasks assessed/performed          Past Medical History:  Diagnosis Date   Fibroids    Past Surgical History:  Procedure Laterality Date   APPENDECTOMY  1996   CESAREAN SECTION  2010   MYOMECTOMY ABDOMINAL APPROACH  2004   Patient Active Problem List   Diagnosis Date Noted   Cataracta 03/11/2024   Chronic low back pain without sciatica 03/11/2024   GERD (gastroesophageal reflux disease) 11/01/2014   Osteoarthritis of right knee 11/01/2014   Fibroid, uterine 11/01/2014    PCP: Diona Perkins, MD   REFERRING PROVIDER:   Orie Milda CROME, MD    REFERRING DIAG:  Diagnosis  M17.0 (ICD-10-CM) - Primary osteoarthritis of both knees    THERAPY DIAG:  Chronic pain of both knees  Stiffness of left knee, not elsewhere classified  Stiffness of right knee, not elsewhere classified  Rationale for Evaluation and Treatment: Rehabilitation  ONSET DATE: chronic  SUBJECTIVE:   SUBJECTIVE STATEMENT: Knee pain started about 15 years ago.  Pt states pain has been increasing in the knees R>L. Pt denies any popping, clicking or catching in the knees.  PERTINENT HISTORY: ---  PAIN:  Are you having pain? Yes: NPRS scale: 3/10-7/10 Pain location: inside R>L Pain description: deep Aggravating factors: stairs Relieving factors: heat, voltaren  PRECAUTIONS: None  RED FLAGS: None   WEIGHT BEARING RESTRICTIONS: No  FALLS:  Has patient fallen in last 6 months? No  LIVING ENVIRONMENT: Lives with: lives with  their family Lives in: House/apartment Stairs: No Has following equipment at home: None  OCCUPATION: psychologist, counselling  PLOF: Independent  PATIENT GOALS: decrease pain  NEXT MD VISIT: not listed  OBJECTIVE:  Note: Objective measures were completed at Evaluation unless otherwise noted.  DIAGNOSTIC FINDINGS: none  PATIENT SURVEYS:  PSFS: THE PATIENT SPECIFIC FUNCTIONAL SCALE  Place score of 0-10 (0 = unable to perform activity and 10 = able to perform activity at the same level as before injury or problem)  Activity Date: 04/10/24    Stairs  6    2. Driving >69 6    3.     4.      Total Score 6      Total Score = Sum of activity scores/number of activities  Minimally Detectable Change: 3 points (for single activity); 2 points (for average score)  [facility] [facility] (nd). The Patient Specific Functional Scale . Retrieved from Skateoasis.com.pt   COGNITION: Overall cognitive status: Within functional limits for tasks assessed     SENSATION: WFL  EDEMA:  Not tested  MUSCLE LENGTH: Hamstrings: Right/Left moderate restriction   POSTURE: + static lateral tilt bilateral patellas  PALPATION: 1+ tenderness R lateral jt line  LOWER EXTREMITY ROM:  Active/Passive ROM Right eval Left eval  Hip flexion    Hip extension    Hip abduction    Hip adduction    Hip internal rotation    Hip  external rotation    Knee flexion 120 122  Knee extension 0 0  Ankle dorsiflexion    Ankle plantarflexion    Ankle inversion    Ankle eversion     (Blank rows = not tested)  LOWER EXTREMITY MMT:  MMT Right eval Left eval  Hip flexion 4 4  Hip extension    Hip abduction 4- 4  Hip adduction 4 4  Hip internal rotation    Hip external rotation    Knee flexion 4+ 4+  Knee extension 4 4  Ankle dorsiflexion    Ankle plantarflexion    Ankle inversion    Ankle eversion     (Blank rows = not tested)  LOWER  EXTREMITY SPECIAL TESTS:  Knee special tests: Bounce negative bilaterally, patellar assessment  FUNCTIONAL TESTS:  5 times sit to stand: 15 sec  GAIT: Distance walked: Financial [employer]ader device utilized: None Level of assistance: Complete Independence                                                                                                                               TREATMENT DATE:  04/10/24  Initial evaluation of bilateral knees completed followed by instruction and trial set of HEP.      PATIENT EDUCATION:  Education details: HEP Person educated: Patient Education method: Explanation, Demonstration, Actor cues, and Verbal cues Education comprehension: verbalized understanding, returned demonstration, verbal cues required, and tactile cues required  HOME EXERCISE PROGRAM: Access Code: [email or web address] URL: [email or web address] Date: 04/10/2024 Prepared by: Burnard Meth  Exercises - Active Straight Leg Raise with Quad Set  - 2 x daily - 7 x weekly - 2 sets - 10 reps - Sidelying Hip Abduction  - 2 x daily - 7 x weekly - 2 sets - 10 reps - Hooklying Hamstring Stretch with Strap  - 2 x daily - 7 x weekly - 1 sets - 3 reps - 25 hold  ASSESSMENT:  CLINICAL IMPRESSION: Patient is a 53 y.o. female who was seen today for physical therapy evaluation and treatment for bilateral knee pain.  She presents with decreased strength of LE major muscles, impaired flexibility and pain.  She would benefit from skilled PT to address these issues and return to previous LOA.   OBJECTIVE IMPAIRMENTS: decreased strength, impaired flexibility, and pain.   ACTIVITY LIMITATIONS: standing, squatting, and stairs  PARTICIPATION LIMITATIONS: driving, community activity, and occupation  PERSONAL FACTORS: None---are also affecting patient's functional outcome.   REHAB POTENTIAL: Good  CLINICAL DECISION MAKING: Stable/uncomplicated  EVALUATION COMPLEXITY: Moderate   GOALS: Goals  reviewed with patient? Yes  SHORT TERM GOALS: Target date: 05/03/2024  3 weeks    Pt to be independent with HEP. Baseline: Goal status: INITIAL  LONG TERM GOALS: Target date: 05/27/2024  6.5 weeks    Increase strength by 1/2 to 1 full grade in affected LE musculature. Baseline:  Goal status: INITIAL  2.  Decrease daily pain to 2/10 or  less and max pain to 5/10 or less.  Baseline: 2>7/10. Goal status: INITIAL  3.  Improve flexibility of lower extremity musculature to minimal restriction. Baseline:  Goal status: INITIAL  4.  Patient to be independent with self progressive HEP by time of discharge. Baseline:  Goal status: INITIAL  5.  Increase score of PSFS by 2 points for measurable difference. Baseline:  Goal status: INITIAL   PLAN:  PT FREQUENCY: 1-2x/week  PT DURATION: 6 weeks  PLANNED INTERVENTIONS: 02835- PT Re-evaluation, 97110-Therapeutic exercises, 97530- Therapeutic activity, [id number]- Neuromuscular re-education, 97535- Self Care, 02859- Manual therapy, [phone]- Aquatic Therapy, [id number]- Electrical stimulation (unattended), Patient/Family education, Stair training, Joint mobilization, Cryotherapy, and Moist heat  PLAN FOR NEXT SESSION: Review HEP.   Burnard CHRISTELLA Meth, PT 04/10/2024, 8:37 AM

## 2024-04-14 ENCOUNTER — Encounter: Payer: Self-pay | Admitting: Family Medicine

## 2024-04-14 ENCOUNTER — Ambulatory Visit (INDEPENDENT_AMBULATORY_CARE_PROVIDER_SITE_OTHER): Admitting: Family Medicine

## 2024-04-14 VITALS — BP 123/76 | HR 79 | Ht 63.0 in | Wt 175.2 lb

## 2024-04-14 DIAGNOSIS — Z0130 Encounter for examination of blood pressure without abnormal findings: Secondary | ICD-10-CM

## 2024-04-14 NOTE — Patient Instructions (Signed)
 Thank you for visiting clinic today and allowing us  to participate in your care!  Your blood pressure looks good today! Please keep it up with healthy eating and staying active.   Please schedule an appointment in 3-6 months or sooner as needed.  Reach out any time with any questions or concerns you may have - we are here for you!  Damien Cassis, MD [facility] [phone]

## 2024-04-14 NOTE — Progress Notes (Unsigned)
    SUBJECTIVE:   CHIEF COMPLAINT / HPI:   Blood pressure -Presenting today for blood pressure follow up after elevated repeat reading during last clinic visit  -Reports no CP, SOB, HC, VC, or extremity swelling  -Has been getting more rest -Has decreased coffee intake    PERTINENT  PMH / PSH: HLD  OBJECTIVE:   BP 123/76   Pulse 79   Ht 5' 3 (1.6 m)   Wt 175 lb 3.2 oz (79.5 kg)   SpO2 99%   BMI 31.04 kg/m   General: Well-appearing. Resting comfortably in room. CV: Normal S1/S2. No extra heart sounds. Warm and well-perfused. Pulm: Breathing comfortably on room air. CTAB. No increased WOB. Abd: Soft, non-tender, non-distended. Skin:  Warm, dry. Psych: Pleasant and appropriate.    ASSESSMENT/PLAN:   Assessment & Plan Blood pressure check Blood pressure unremarkable today. Asymptomatic at this time. No indication for anti-hypertensive at this time. Encouraged patient to continue healthy diet and activity.     RTC in 3-6 months for follow up or sooner as needed.   Damien Cassis, MD [facility]

## 2024-04-20 ENCOUNTER — Ambulatory Visit: Admitting: Physical Therapy

## 2024-04-20 ENCOUNTER — Encounter: Payer: Self-pay | Admitting: Physical Therapy

## 2024-04-20 DIAGNOSIS — M25662 Stiffness of left knee, not elsewhere classified: Secondary | ICD-10-CM

## 2024-04-20 DIAGNOSIS — G8929 Other chronic pain: Secondary | ICD-10-CM

## 2024-04-20 DIAGNOSIS — M25562 Pain in left knee: Secondary | ICD-10-CM

## 2024-04-20 DIAGNOSIS — M25561 Pain in right knee: Secondary | ICD-10-CM

## 2024-04-20 DIAGNOSIS — M25661 Stiffness of right knee, not elsewhere classified: Secondary | ICD-10-CM | POA: Diagnosis not present

## 2024-04-20 NOTE — Therapy (Signed)
 OUTPATIENT PHYSICAL THERAPY LOWER EXTREMITY   Patient Name: Whitney Johns MRN: [id number] DOB:09-Jan-1971, 53 y.o., female Today's Date: 04/20/2024  END OF SESSION:  PT End of Session - 04/20/24 0945     Visit Number 2    Number of Visits 13    Date for Recertification  05/27/24    PT Start Time 0943    PT Stop Time 1025    PT Time Calculation (min) 42 min    Activity Tolerance Patient tolerated treatment well    Behavior During Therapy [facility] for tasks assessed/performed           Past Medical History:  Diagnosis Date   Fibroids    Past Surgical History:  Procedure Laterality Date   APPENDECTOMY  1996   CESAREAN SECTION  2010   MYOMECTOMY ABDOMINAL APPROACH  2004   Patient Active Problem List   Diagnosis Date Noted   Cataracta 03/11/2024   Chronic low back pain without sciatica 03/11/2024   GERD (gastroesophageal reflux disease) 11/01/2014   Osteoarthritis of right knee 11/01/2014   Fibroid, uterine 11/01/2014    PCP: Diona Perkins, MD   REFERRING PROVIDER:   Orie Milda CROME, MD    REFERRING DIAG:  Diagnosis  M17.0 (ICD-10-CM) - Primary osteoarthritis of both knees    THERAPY DIAG:  Chronic pain of both knees  Stiffness of left knee, not elsewhere classified  Stiffness of right knee, not elsewhere classified  Rationale for Evaluation and Treatment: Rehabilitation  ONSET DATE: chronic  SUBJECTIVE:   SUBJECTIVE STATEMENT: Pt arriving today reporting rt knee pain is worse than left. Pt feels her exercises went well. Pt stating she didn't do them every day but most days.     PERTINENT HISTORY: ---  PAIN:  Are you having pain? 5/10 in bil knees    PRECAUTIONS: None  RED FLAGS: None   WEIGHT BEARING RESTRICTIONS: No  FALLS:  Has patient fallen in last 6 months? No  LIVING ENVIRONMENT: Lives with: lives with their family Lives in: House/apartment Stairs: No Has following equipment at home: None  OCCUPATION: designer, jewellery  PLOF: Independent  PATIENT GOALS: decrease pain  NEXT MD VISIT: not listed  OBJECTIVE:  Note: Objective measures were completed at Evaluation unless otherwise noted.  DIAGNOSTIC FINDINGS: none  PATIENT SURVEYS:  PSFS: THE PATIENT SPECIFIC FUNCTIONAL SCALE  Place score of 0-10 (0 = unable to perform activity and 10 = able to perform activity at the same level as before injury or problem)  Activity Date: 04/10/24    Stairs  6    2. Driving >69 6    3.     4.      Total Score 6      Total Score = Sum of activity scores/number of activities  Minimally Detectable Change: 3 points (for single activity); 2 points (for average score)  [employer] [facility] (nd). The Patient Specific Functional Scale . Retrieved from Skateoasis.com.[email or web address]   COGNITION: Overall cognitive status: Within functional limits for tasks assessed     SENSATION: WFL  EDEMA:  Not tested  MUSCLE LENGTH: Hamstrings: Right/Left moderate restriction   POSTURE: + static lateral tilt bilateral patellas  PALPATION: 1+ tenderness R lateral jt line  LOWER EXTREMITY ROM:  Active/Passive ROM Right eval Left eval  Hip flexion    Hip extension    Hip abduction    Hip adduction    Hip internal rotation    Hip external rotation    Knee flexion 120 122  Knee extension 0 0  Ankle dorsiflexion    Ankle plantarflexion    Ankle inversion    Ankle eversion     (Blank rows = not tested)  LOWER EXTREMITY MMT:  MMT Right eval Left eval  Hip flexion 4 4  Hip extension    Hip abduction 4- 4  Hip adduction 4 4  Hip internal rotation    Hip external rotation    Knee flexion 4+ 4+  Knee extension 4 4  Ankle dorsiflexion    Ankle plantarflexion    Ankle inversion    Ankle eversion     (Blank rows = not tested)  LOWER EXTREMITY SPECIAL TESTS:  Knee special tests: Bounce negative bilaterally, patellar assessment  FUNCTIONAL TESTS:   5 times sit to stand: 15 sec  GAIT: Distance walked: community  Assistive device utilized: None Level of assistance: Complete Independence                                                                                                                               TREATMENT DATE:  04/20/24:  TherEx: LAQ 2 x 10 bil LE Seated SLR  2 x 10 Bridges: holding 5 sec x 10  Side lying: hip abduction 2 x 10 bil   Calf stretch: x 2 holding 30 sec c UE support TherActivites Nustep: Level 5 x 6 minutes UE/LE (push/pull, strengthening and ROM) Sit to stand: x 10  Double Leg Press: 50# 3  x 10  Single Leg Press: 31# x 10 bil  NMR:  Standing on Airex: feet together, staggered stance, tandem stance bil all x 30 sec    04/10/24  Initial evaluation of bilateral knees completed followed by instruction and trial set of HEP.      PATIENT EDUCATION:  Education details: HEP Person educated: Patient Education method: Explanation, Demonstration, Actor cues, and Verbal cues Education comprehension: verbalized understanding, returned demonstration, verbal cues required, and tactile cues required  HOME EXERCISE PROGRAM: Access Code: [email or web address] URL: [email or web address] Date: 04/10/2024 Prepared by: Burnard Meth  Exercises - Active Straight Leg Raise with Quad Set  - 2 x daily - 7 x weekly - 2 sets - 10 reps - Sidelying Hip Abduction  - 2 x daily - 7 x weekly - 2 sets - 10 reps - Hooklying Hamstring Stretch with Strap  - 2 x daily - 7 x weekly - 1 sets - 3 reps - 25 hold  ASSESSMENT:  CLINICAL IMPRESSION: Pt reporting 5/10 pain in bil knees at beginning of session. Pt tolerating exercises well. Pt's HEP was reviewed. Pt with noted difference in her strength/fatigue of her Rt compared to her left with her right being weaker. Recommending continued skilled PT interventions.   OBJECTIVE IMPAIRMENTS: decreased strength, impaired flexibility, and pain.   ACTIVITY LIMITATIONS:  standing, squatting, and stairs  PARTICIPATION LIMITATIONS: driving, community activity, and occupation  PERSONAL FACTORS: None---are also affecting patient's functional outcome.   REHAB POTENTIAL: Good  CLINICAL DECISION MAKING: Stable/uncomplicated  EVALUATION COMPLEXITY: Moderate   GOALS: Goals reviewed with patient? Yes  SHORT TERM GOALS: Target date: 05/03/2024  3 weeks    Pt to be independent with HEP. Baseline: Goal status: Ongoing 04/20/24  LONG TERM GOALS: Target date: 05/27/2024  6.5 weeks    Increase strength by 1/2 to 1 full grade in affected LE musculature. Baseline:  Goal status: INITIAL  2.  Decrease daily pain to 2/10 or less and max pain to 5/10 or less.  Baseline: 2>7/10. Goal status: INITIAL  3.  Improve flexibility of lower extremity musculature to minimal restriction. Baseline:  Goal status: INITIAL  4.  Patient to be independent with self progressive HEP by time of discharge. Baseline:  Goal status: INITIAL  5.  Increase score of PSFS by 2 points for measurable difference. Baseline:  Goal status: INITIAL   PLAN:  PT FREQUENCY: 1-2x/week  PT DURATION: 6 weeks  PLANNED INTERVENTIONS: 97164- PT Re-evaluation, 97110-Therapeutic exercises, 97530- Therapeutic activity, 97112- Neuromuscular re-education, 97535- Self Care, 02859- Manual therapy, [phone]- Aquatic Therapy, [phone]- Electrical stimulation (unattended), Patient/Family education, Stair training, Joint mobilization, Cryotherapy, and Moist heat  PLAN FOR NEXT SESSION: Knee strengthening, SLS, functional mobility   Delon JONELLE Lunger, PT, MPT 04/20/2024, 10:24 AM

## 2024-04-22 ENCOUNTER — Ambulatory Visit

## 2024-04-28 ENCOUNTER — Inpatient Hospital Stay: Admission: RE | Admit: 2024-04-28 | Discharge: 2024-04-28

## 2024-04-28 DIAGNOSIS — Z0000 Encounter for general adult medical examination without abnormal findings: Secondary | ICD-10-CM

## 2024-04-29 NOTE — Therapy (Signed)
 OUTPATIENT PHYSICAL THERAPY LOWER EXTREMITY   Patient Name: Whitney Johns MRN: [id number] DOB:[birth year], 53 y.o., female Today's Date: 05/01/2024  END OF SESSION:  PT End of Session - 05/01/24 1114     Visit Number 3    Number of Visits 13    Date for Recertification  05/27/24    PT Start Time 1050    PT Stop Time 1130    PT Time Calculation (min) 40 min    Activity Tolerance Patient tolerated treatment well    Behavior During Therapy [facility] for tasks assessed/performed            Past Medical History:  Diagnosis Date   Fibroids    Past Surgical History:  Procedure Laterality Date   APPENDECTOMY  1996   CESAREAN SECTION  2010   MYOMECTOMY ABDOMINAL APPROACH  2004   Patient Active Problem List   Diagnosis Date Noted   Cataracta 03/11/2024   Chronic low back pain without sciatica 03/11/2024   GERD (gastroesophageal reflux disease) 11/01/2014   Osteoarthritis of right knee 11/01/2014   Fibroid, uterine 11/01/2014    PCP: Diona Perkins, MD   REFERRING PROVIDER:   Orie Milda CROME, MD    REFERRING DIAG:  Diagnosis  M17.0 (ICD-10-CM) - Primary osteoarthritis of both knees    THERAPY DIAG:  Chronic pain of both knees  Stiffness of left knee, not elsewhere classified  Stiffness of right knee, not elsewhere classified  Rationale for Evaluation and Treatment: Rehabilitation  ONSET DATE: chronic  SUBJECTIVE:   SUBJECTIVE STATEMENT: Pt states once in a while I get a dull ache.  PERTINENT HISTORY: ---  PAIN:  Are you having pain? 5/10 in bil knees    PRECAUTIONS: None  RED FLAGS: None   WEIGHT BEARING RESTRICTIONS: No  FALLS:  Has patient fallen in last 6 months? No  LIVING ENVIRONMENT: Lives with: lives with their family Lives in: House/apartment Stairs: No Has following equipment at home: None  OCCUPATION: psychologist, counselling  PLOF: Independent  PATIENT GOALS: decrease pain  NEXT MD VISIT: not listed  OBJECTIVE:  Note:  Objective measures were completed at Evaluation unless otherwise noted.  DIAGNOSTIC FINDINGS: none  PATIENT SURVEYS:  PSFS: THE PATIENT SPECIFIC FUNCTIONAL SCALE  Place score of 0-10 (0 = unable to perform activity and 10 = able to perform activity at the same level as before injury or problem)  Activity Date: 04/10/24    Stairs  6    2. Driving >69 6    3.     4.      Total Score 6      Total Score = Sum of activity scores/number of activities  Minimally Detectable Change: 3 points (for single activity); 2 points (for average score)  [facility] (nd). The Patient Specific Functional Scale . Retrieved from [email or web address]   COGNITION: Overall cognitive status: Within functional limits for tasks assessed     SENSATION: WFL  EDEMA:  Not tested  MUSCLE LENGTH: Hamstrings: Right/Left moderate restriction   POSTURE: + static lateral tilt bilateral patellas  PALPATION: 1+ tenderness R lateral jt line  LOWER EXTREMITY ROM:  Active/Passive ROM Right eval Left eval  Hip flexion    Hip extension    Hip abduction    Hip adduction    Hip internal rotation    Hip external rotation    Knee flexion 120 122  Knee extension 0 0  Ankle dorsiflexion    Ankle plantarflexion    Ankle inversion  Ankle eversion     (Blank rows = not tested)  LOWER EXTREMITY MMT:  MMT Right eval Left eval  Hip flexion 4 4  Hip extension    Hip abduction 4- 4  Hip adduction 4 4  Hip internal rotation    Hip external rotation    Knee flexion 4+ 4+  Knee extension 4 4  Ankle dorsiflexion    Ankle plantarflexion    Ankle inversion    Ankle eversion     (Blank rows = not tested)  LOWER EXTREMITY SPECIAL TESTS:  Knee special tests: Bounce negative bilaterally, patellar assessment  FUNCTIONAL TESTS:  5 times sit to stand: 15 sec  GAIT: Distance walked: Financial Trader device utilized: None Level of  assistance: Complete Independence                                                                                                                               TREATMENT DATE: Bil Knee Pain 05/01/24 TherEx: Seated SLR  2 x 10 #1 bil LE Seated SLR  2 x 10 Bridges: holding 5 sec x 10  Side lying: hip abduction 2 x 10 bil   Calf stretch: x 3 holding 30 sec on incline board  TherActivites: Nustep: Level 5 x 6 minutes UE/LE (push/pull, strengthening and ROM) Sit to stand: 2lb ball hold 2x 10  Double Leg Press: 75# 3  x 10  Single Leg Press: 37#   10 bil  NMR:  Standing Tandem 4x 25 sec Standing on Air ex in tandem 4x 25 sec   04/20/24:  TherEx: LAQ 2 x 10 bil LE Seated SLR  2 x 10 Bridges: holding 5 sec x 10  Side lying: hip abduction 2 x 10 bil   Calf stretch: x 2 holding 30 sec c UE support TherActivites Nustep: Level 5 x 6 minutes UE/LE (push/pull, strengthening and ROM) Sit to stand: x 10  Double Leg Press: 50# 3  x 10  Single Leg Press: 31# x 10 bil  NMR:  Standing on Airex: feet together, staggered stance, tandem stance bil all x 30 sec    04/10/24  Initial evaluation of bilateral knees completed followed by instruction and trial set of HEP.      PATIENT EDUCATION:  Education details: HEP Person educated: Patient Education method: Explanation, Demonstration, Actor cues, and Verbal cues Education comprehension: verbalized understanding, returned demonstration, verbal cues required, and tactile cues required  HOME EXERCISE PROGRAM: Access Code: [email or web address] URL: [email or web address] Date: 04/10/2024 Prepared by: Burnard Meth  Exercises - Active Straight Leg Raise with Quad Set  - 2 x daily - 7 x weekly - 2 sets - 10 reps - Sidelying Hip Abduction  - 2 x daily - 7 x weekly - 2 sets - 10 reps - Hooklying Hamstring Stretch with Strap  - 2 x daily - 7 x weekly - 1 sets - 3 reps - 25 hold  ASSESSMENT:  CLINICAL IMPRESSION:  Pt needed VC for proper foot  position with sit to stand exercise.  Demonstrated understanding.  STG completed.     OBJECTIVE IMPAIRMENTS: decreased strength, impaired flexibility, and pain.   ACTIVITY LIMITATIONS: standing, squatting, and stairs  PARTICIPATION LIMITATIONS: driving, community activity, and occupation  PERSONAL FACTORS: None---are also affecting patient's functional outcome.   REHAB POTENTIAL: Good  CLINICAL DECISION MAKING: Stable/uncomplicated  EVALUATION COMPLEXITY: Moderate   GOALS: Goals reviewed with patient? Yes  SHORT TERM GOALS: Target date: 05/03/2024  3 weeks  MET    Pt to be independent with HEP. Baseline: Goal status: MET 05/01/24  LONG TERM GOALS: Target date: 05/27/2024  6.5 weeks    Increase strength by 1/2 to 1 full grade in affected LE musculature. Baseline:  Goal status: INITIAL  2.  Decrease daily pain to 2/10 or less and max pain to 5/10 or less.  Baseline: 2>7/10. Goal status: INITIAL  3.  Improve flexibility of lower extremity musculature to minimal restriction. Baseline:  Goal status: INITIAL  4.  Patient to be independent with self progressive HEP by time of discharge. Baseline:  Goal status: INITIAL  5.  Increase score of PSFS by 2 points for measurable difference. Baseline:  Goal status: INITIAL   PLAN:  PT FREQUENCY: 1-2x/week  PT DURATION: 6 weeks  PLANNED INTERVENTIONS: 97164- PT Re-evaluation, 97110-Therapeutic exercises, 97530- Therapeutic activity, 97112- Neuromuscular re-education, 97535- Self Care, 02859- Manual therapy, [phone]- Aquatic Therapy, [phone]- Electrical stimulation (unattended), Patient/Family education, Stair training, Joint mobilization, Cryotherapy, and Moist heat  PLAN FOR NEXT SESSION: Knee strengthening, SLS, functional mobility   Burnard Meth, PT 05/01/24  11:40 AM

## 2024-05-01 ENCOUNTER — Ambulatory Visit

## 2024-05-01 DIAGNOSIS — M25662 Stiffness of left knee, not elsewhere classified: Secondary | ICD-10-CM | POA: Diagnosis not present

## 2024-05-01 DIAGNOSIS — M25661 Stiffness of right knee, not elsewhere classified: Secondary | ICD-10-CM | POA: Diagnosis not present

## 2024-05-01 DIAGNOSIS — M25562 Pain in left knee: Secondary | ICD-10-CM | POA: Diagnosis not present

## 2024-05-01 DIAGNOSIS — M25561 Pain in right knee: Secondary | ICD-10-CM

## 2024-05-01 DIAGNOSIS — G8929 Other chronic pain: Secondary | ICD-10-CM

## 2024-05-05 ENCOUNTER — Encounter (HOSPITAL_BASED_OUTPATIENT_CLINIC_OR_DEPARTMENT_OTHER): Payer: Self-pay | Admitting: Internal Medicine

## 2024-05-07 NOTE — Therapy (Signed)
 OUTPATIENT PHYSICAL THERAPY LOWER EXTREMITY   Patient Name: Whitney Johns MRN: [id number] DOB:Mar 26, 1971, 53 y.o., female Today's Date: 05/08/2024  END OF SESSION:  PT End of Session - 05/08/24 0900     Visit Number 4    Number of Visits 13    Date for Recertification  05/27/24    PT Start Time 0855    PT Stop Time 0933    PT Time Calculation (min) 38 min    Activity Tolerance Patient tolerated treatment well    Behavior During Therapy [facility] for tasks assessed/performed             Past Medical History:  Diagnosis Date   Fibroids    Past Surgical History:  Procedure Laterality Date   APPENDECTOMY  1996   CESAREAN SECTION  2010   MYOMECTOMY ABDOMINAL APPROACH  2004   Patient Active Problem List   Diagnosis Date Noted   Cataracta 03/11/2024   Chronic low back pain without sciatica 03/11/2024   GERD (gastroesophageal reflux disease) 11/01/2014   Osteoarthritis of right knee 11/01/2014   Fibroid, uterine 11/01/2014    PCP: Diona Perkins, MD   REFERRING PROVIDER:   Orie Milda CROME, MD    REFERRING DIAG:  Diagnosis  M17.0 (ICD-10-CM) - Primary osteoarthritis of both knees    THERAPY DIAG:  Chronic pain of both knees  Stiffness of left knee, not elsewhere classified  Stiffness of right knee, not elsewhere classified  Rationale for Evaluation and Treatment: Rehabilitation  ONSET DATE: chronic  SUBJECTIVE:   SUBJECTIVE STATEMENT: Pt states she has been phasing back in gym activities and its fine for the knees.  PERTINENT HISTORY: ---  PAIN:  Are you having pain? 0/10 in bil knees    PRECAUTIONS: None  RED FLAGS: None   WEIGHT BEARING RESTRICTIONS: No  FALLS:  Has patient fallen in last 6 months? No  LIVING ENVIRONMENT: Lives with: lives with their family Lives in: House/apartment Stairs: No Has following equipment at home: None  OCCUPATION: psychologist, counselling  PLOF: Independent  PATIENT GOALS: decrease pain  NEXT MD VISIT:  not listed  OBJECTIVE:  Note: Objective measures were completed at Evaluation unless otherwise noted.  DIAGNOSTIC FINDINGS: none  PATIENT SURVEYS:  PSFS: THE PATIENT SPECIFIC FUNCTIONAL SCALE  Place score of 0-10 (0 = unable to perform activity and 10 = able to perform activity at the same level as before injury or problem)  Activity Date: 04/10/24    Stairs  6    2. Driving >69 6    3.     4.      Total Score 6      Total Score = Sum of activity scores/number of activities  Minimally Detectable Change: 3 points (for single activity); 2 points (for average score)  [facility] (nd). The Patient Specific Functional Scale . Retrieved from [email or web address]   COGNITION: Overall cognitive status: Within functional limits for tasks assessed     SENSATION: WFL  EDEMA:  Not tested  MUSCLE LENGTH: Hamstrings: Right/Left moderate restriction   POSTURE: + static lateral tilt bilateral patellas  PALPATION: 1+ tenderness R lateral jt line  LOWER EXTREMITY ROM:  Active/Passive ROM Right eval Left eval  Hip flexion    Hip extension    Hip abduction    Hip adduction    Hip internal rotation    Hip external rotation    Knee flexion 120 122  Knee extension 0 0  Ankle dorsiflexion    Ankle plantarflexion  Ankle inversion    Ankle eversion     (Blank rows = not tested)  LOWER EXTREMITY MMT:  MMT Right eval Left eval  Hip flexion 4 4  Hip extension    Hip abduction 4- 4  Hip adduction 4 4  Hip internal rotation    Hip external rotation    Knee flexion 4+ 4+  Knee extension 4 4  Ankle dorsiflexion    Ankle plantarflexion    Ankle inversion    Ankle eversion     (Blank rows = not tested)  LOWER EXTREMITY SPECIAL TESTS:  Knee special tests: Bounce negative bilaterally, patellar assessment  FUNCTIONAL TESTS:  5 times sit to stand: 15 sec  GAIT: Distance walked: Financial Trader  device utilized: None Level of assistance: Complete Independence                                                                                                                               TREATMENT DATE: Bil Knee Pain 05/08/24 TherActivites: Nustep: Level 5 x 6 minutes UE/LE (push/pull, strengthening and ROM) Sit to stand: 2lb ball hold 2x 10  Double Leg Press: 87# 3  x 10  Single Leg Press: 43#   2x10 bil  TherEx: Seated SLR  2 x 10 #1.5 bil LE Bridges: holding 5 sec 2 x 10  Side lying: hip abduction 2 x 10 bil   Calf stretch: x 3 holding 30 sec on incline board   05/01/24 TherEx: Seated SLR  2 x 10 #1 bil LE Seated SLR  2 x 10 Bridges: holding 5 sec x 10  Side lying: hip abduction 2 x 10 bil   Calf stretch: x 3 holding 30 sec on incline board  TherActivites: Nustep: Level 5 x 6 minutes UE/LE (push/pull, strengthening and ROM) Sit to stand: 2lb ball hold 2x 10  Double Leg Press: 75# 3  x 10  Single Leg Press: 37#   10 bil  NMR:  Standing Tandem 4x 25 sec Standing on Air ex in tandem 4x 25 sec   04/20/24:  TherEx: LAQ 2 x 10 bil LE Seated SLR  2 x 10 Bridges: holding 5 sec x 10  Side lying: hip abduction 2 x 10 bil   Calf stretch: x 2 holding 30 sec c UE support TherActivites Nustep: Level 5 x 6 minutes UE/LE (push/pull, strengthening and ROM) Sit to stand: x 10  Double Leg Press: 50# 3  x 10  Single Leg Press: 31# x 10 bil  NMR:  Standing on Airex: feet together, staggered stance, tandem stance bil all x 30 sec    04/10/24  Initial evaluation of bilateral knees completed followed by instruction and trial set of HEP.      PATIENT EDUCATION:  Education details: HEP Person educated: Patient Education method: Explanation, Demonstration, Actor cues, and Verbal cues Education comprehension: verbalized understanding, returned demonstration, verbal cues required, and tactile cues required  HOME EXERCISE  PROGRAM: Access Code: [email or web address] URL:  [email or web address] Date: 04/10/2024 Prepared by: Burnard Meth  Exercises - Active Straight Leg Raise with Quad Set  - 2 x daily - 7 x weekly - 2 sets - 10 reps - Sidelying Hip Abduction  - 2 x daily - 7 x weekly - 2 sets - 10 reps - Hooklying Hamstring Stretch with Strap  - 2 x daily - 7 x weekly - 1 sets - 3 reps - 25 hold  ASSESSMENT:  CLINICAL IMPRESSION: Pt demonstrated increased strength with ability to increase weight with leg press and no difficulty.     OBJECTIVE IMPAIRMENTS: decreased strength, impaired flexibility, and pain.   ACTIVITY LIMITATIONS: standing, squatting, and stairs  PARTICIPATION LIMITATIONS: driving, community activity, and occupation  PERSONAL FACTORS: None---are also affecting patient's functional outcome.   REHAB POTENTIAL: Good  CLINICAL DECISION MAKING: Stable/uncomplicated  EVALUATION COMPLEXITY: Moderate   GOALS: Goals reviewed with patient? Yes  SHORT TERM GOALS: Target date: 05/03/2024  3 weeks  MET    Pt to be independent with HEP. Baseline: Goal status: MET 05/01/24  LONG TERM GOALS: Target date: 05/27/2024  6.5 weeks    Increase strength by 1/2 to 1 full grade in affected LE musculature. Baseline:  Goal status: INITIAL  2.  Decrease daily pain to 2/10 or less and max pain to 5/10 or less.  Baseline: 2>7/10. Goal status: INITIAL  3.  Improve flexibility of lower extremity musculature to minimal restriction. Baseline:  Goal status: INITIAL  4.  Patient to be independent with self progressive HEP by time of discharge. Baseline:  Goal status: INITIAL  5.  Increase score of PSFS by 2 points for measurable difference. Baseline:  Goal status: INITIAL   PLAN:  PT FREQUENCY: 1-2x/week  PT DURATION: 6 weeks  PLANNED INTERVENTIONS: 97164- PT Re-evaluation, 97110-Therapeutic exercises, 97530- Therapeutic activity, 97112- Neuromuscular re-education, 97535- Self Care, 02859- Manual therapy, [phone]- Aquatic  Therapy, [phone]- Electrical stimulation (unattended), Patient/Family education, Stair training, Joint mobilization, Cryotherapy, and Moist heat  PLAN FOR NEXT SESSION: Knee strengthening, SLS, functional mobility   Burnard Meth, PT 05/08/2024  9:35 AM

## 2024-05-08 ENCOUNTER — Ambulatory Visit

## 2024-05-08 DIAGNOSIS — G8929 Other chronic pain: Secondary | ICD-10-CM | POA: Diagnosis not present

## 2024-05-08 DIAGNOSIS — M25661 Stiffness of right knee, not elsewhere classified: Secondary | ICD-10-CM

## 2024-05-08 DIAGNOSIS — M25562 Pain in left knee: Secondary | ICD-10-CM | POA: Diagnosis not present

## 2024-05-08 DIAGNOSIS — M25662 Stiffness of left knee, not elsewhere classified: Secondary | ICD-10-CM | POA: Diagnosis not present

## 2024-05-08 DIAGNOSIS — M25561 Pain in right knee: Secondary | ICD-10-CM

## 2024-05-13 NOTE — Therapy (Signed)
 OUTPATIENT PHYSICAL THERAPY LOWER EXTREMITY   Patient Name: Whitney Johns MRN: [id number] DOB:Feb 26, 1971, 53 y.o., female Today's Date: 05/13/2024  END OF SESSION:       Past Medical History:  Diagnosis Date   Fibroids    Past Surgical History:  Procedure Laterality Date   APPENDECTOMY  1996   CESAREAN SECTION  2010   MYOMECTOMY ABDOMINAL APPROACH  2004   Patient Active Problem List   Diagnosis Date Noted   Cataracta 03/11/2024   Chronic low back pain without sciatica 03/11/2024   GERD (gastroesophageal reflux disease) 11/01/2014   Osteoarthritis of right knee 11/01/2014   Fibroid, uterine 11/01/2014    PCP: Diona Perkins, MD   REFERRING PROVIDER:   Orie Milda CROME, MD    REFERRING DIAG:  Diagnosis  M17.0 (ICD-10-CM) - Primary osteoarthritis of both knees    THERAPY DIAG:  No diagnosis found.  Rationale for Evaluation and Treatment: Rehabilitation  ONSET DATE: chronic  SUBJECTIVE:   SUBJECTIVE STATEMENT: ***Pt states she has been phasing back in gym activities and its fine for the knees.  PERTINENT HISTORY: ---  PAIN:  ***Are you having pain? 0/10 in bil knees    PRECAUTIONS: None  RED FLAGS: None   WEIGHT BEARING RESTRICTIONS: No  FALLS:  Has patient fallen in last 6 months? No  LIVING ENVIRONMENT: Lives with: lives with their family Lives in: House/apartment Stairs: No Has following equipment at home: None  OCCUPATION: psychologist, counselling  PLOF: Independent  PATIENT GOALS: decrease pain  NEXT MD VISIT: not listed  OBJECTIVE:  Note: Objective measures were completed at Evaluation unless otherwise noted.  DIAGNOSTIC FINDINGS: none  PATIENT SURVEYS:  PSFS: THE PATIENT SPECIFIC FUNCTIONAL SCALE  Place score of 0-10 (0 = unable to perform activity and 10 = able to perform activity at the same level as before injury or problem)  Activity Date: 04/10/24 Date:  05/15/24   Stairs  6 ***   2. Driving >69 6    3.     4.       Total Score 6      Total Score = Sum of activity scores/number of activities  Minimally Detectable Change: 3 points (for single activity); 2 points (for average score)  [employer] [facility] (nd). The Patient Specific Functional Scale . Retrieved from [email or web address]   COGNITION: Overall cognitive status: Within functional limits for tasks assessed     SENSATION: WFL  EDEMA:  Not tested  MUSCLE LENGTH: Hamstrings: Right/Left moderate restriction   POSTURE: + static lateral tilt bilateral patellas  PALPATION: 1+ tenderness R lateral jt line  LOWER EXTREMITY ROM:  Active/Passive ROM Right eval Left eval  Hip flexion    Hip extension    Hip abduction    Hip adduction    Hip internal rotation    Hip external rotation    Knee flexion 120 122  Knee extension 0 0  Ankle dorsiflexion    Ankle plantarflexion    Ankle inversion    Ankle eversion     (Blank rows = not tested)  LOWER EXTREMITY MMT:  MMT Right eval Left eval 05/15/24 R/L  Hip flexion 4 4 ***  Hip extension     Hip abduction 4- 4   Hip adduction 4 4   Hip internal rotation     Hip external rotation     Knee flexion 4+ 4+   Knee extension 4 4   Ankle dorsiflexion     Ankle plantarflexion  Ankle inversion     Ankle eversion      (Blank rows = not tested)  LOWER EXTREMITY SPECIAL TESTS:  Knee special tests: Bounce negative bilaterally, patellar assessment  FUNCTIONAL TESTS:  5 times sit to stand: 15 sec  GAIT: Distance walked: Financial Trader device utilized: None Level of assistance: Complete Independence                                                                                                                               TREATMENT DATE: Bil Knee Pain 05/15/24***     05/08/24 TherActivites: Nustep: Level 5 x 6 minutes UE/LE (push/pull, strengthening and ROM) Sit to stand: 2lb ball hold 2x 10  Double Leg  Press: 87# 3  x 10  Single Leg Press: 43#   2x10 bil  TherEx: Seated SLR  2 x 10 #1.5 bil LE Bridges: holding 5 sec 2 x 10  Side lying: hip abduction 2 x 10 bil   Calf stretch: x 3 holding 30 sec on incline board   05/01/24 TherEx: Seated SLR  2 x 10 #1 bil LE Seated SLR  2 x 10 Bridges: holding 5 sec x 10  Side lying: hip abduction 2 x 10 bil   Calf stretch: x 3 holding 30 sec on incline board  TherActivites: Nustep: Level 5 x 6 minutes UE/LE (push/pull, strengthening and ROM) Sit to stand: 2lb ball hold 2x 10  Double Leg Press: 75# 3  x 10  Single Leg Press: 37#   10 bil  NMR:  Standing Tandem 4x 25 sec Standing on Air ex in tandem 4x 25 sec   04/20/24:  TherEx: LAQ 2 x 10 bil LE Seated SLR  2 x 10 Bridges: holding 5 sec x 10  Side lying: hip abduction 2 x 10 bil   Calf stretch: x 2 holding 30 sec c UE support TherActivites Nustep: Level 5 x 6 minutes UE/LE (push/pull, strengthening and ROM) Sit to stand: x 10  Double Leg Press: 50# 3  x 10  Single Leg Press: 31# x 10 bil  NMR:  Standing on Airex: feet together, staggered stance, tandem stance bil all x 30 sec    04/10/24  Initial evaluation of bilateral knees completed followed by instruction and trial set of HEP.      PATIENT EDUCATION:  Education details: HEP Person educated: Patient Education method: Explanation, Demonstration, Actor cues, and Verbal cues Education comprehension: verbalized understanding, returned demonstration, verbal cues required, and tactile cues required  HOME EXERCISE PROGRAM: Access Code: [email or web address] URL: [email or web address] Date: 04/10/2024 Prepared by: Burnard Meth  Exercises - Active Straight Leg Raise with Quad Set  - 2 x daily - 7 x weekly - 2 sets - 10 reps - Sidelying Hip Abduction  - 2 x daily - 7 x weekly - 2 sets - 10 reps - Hooklying Hamstring Stretch with Strap  - 2 x daily - 7 x weekly -  1 sets - 3 reps - 25 hold  ASSESSMENT:  CLINICAL  IMPRESSION: ***Pt demonstrated increased strength with ability to increase weight with leg press and no difficulty.     OBJECTIVE IMPAIRMENTS: decreased strength, impaired flexibility, and pain.   ACTIVITY LIMITATIONS: standing, squatting, and stairs  PARTICIPATION LIMITATIONS: driving, community activity, and occupation  PERSONAL FACTORS: None---are also affecting patient's functional outcome.   REHAB POTENTIAL: Good  CLINICAL DECISION MAKING: Stable/uncomplicated  EVALUATION COMPLEXITY: Moderate   GOALS: Goals reviewed with patient? Yes  SHORT TERM GOALS: Target date: 05/03/2024  3 weeks  MET    Pt to be independent with HEP. Baseline: Goal status: MET 05/01/24  LONG TERM GOALS: Target date: 05/27/2024  6.5 weeks    Increase strength by 1/2 to 1 full grade in affected LE musculature. Baseline:  Goal status: INITIAL  2.  Decrease daily pain to 2/10 or less and max pain to 5/10 or less.  Baseline: 2>7/10. Goal status: INITIAL  3.  Improve flexibility of lower extremity musculature to minimal restriction. Baseline:  Goal status: INITIAL  4.  Patient to be independent with self progressive HEP by time of discharge. Baseline:  Goal status: INITIAL  5.  Increase score of PSFS by 2 points for measurable difference. Baseline:  Goal status: INITIAL   PLAN:  PT FREQUENCY: 1-2x/week  PT DURATION: 6 weeks  PLANNED INTERVENTIONS: 97164- PT Re-evaluation, 97110-Therapeutic exercises, 97530- Therapeutic activity, 97112- Neuromuscular re-education, 97535- Self Care, 02859- Manual therapy, [phone]- Aquatic Therapy, [phone]- Electrical stimulation (unattended), Patient/Family education, Stair training, Joint mobilization, Cryotherapy, and Moist heat  PLAN FOR NEXT SESSION: ***Knee strengthening, SLS, functional mobility   Burnard Meth, PT 05/13/2024  4:38 PM

## 2024-05-15 ENCOUNTER — Encounter

## 2024-05-15 DIAGNOSIS — G8929 Other chronic pain: Secondary | ICD-10-CM | POA: Diagnosis not present

## 2024-05-15 DIAGNOSIS — M25561 Pain in right knee: Secondary | ICD-10-CM

## 2024-05-15 DIAGNOSIS — M25661 Stiffness of right knee, not elsewhere classified: Secondary | ICD-10-CM

## 2024-05-15 DIAGNOSIS — M25662 Stiffness of left knee, not elsewhere classified: Secondary | ICD-10-CM

## 2024-05-15 DIAGNOSIS — M25562 Pain in left knee: Secondary | ICD-10-CM

## 2024-05-19 ENCOUNTER — Encounter: Payer: Self-pay | Admitting: Gastroenterology

## 2024-05-19 ENCOUNTER — Ambulatory Visit: Admitting: Gastroenterology

## 2024-05-19 VITALS — BP 110/60 | HR 75 | Ht 63.0 in | Wt 172.1 lb

## 2024-05-19 DIAGNOSIS — R140 Abdominal distension (gaseous): Secondary | ICD-10-CM | POA: Diagnosis not present

## 2024-05-19 DIAGNOSIS — Z1211 Encounter for screening for malignant neoplasm of colon: Secondary | ICD-10-CM

## 2024-05-19 MED ORDER — NA SULFATE-K SULFATE-MG SULF 17.5-3.13-1.6 GM/177ML PO SOLN: 1.0000 | Freq: Once | ORAL | 0 refills | Status: AC

## 2024-05-19 NOTE — Progress Notes (Addendum)
 "  Chief Complaint:colonoscopy, bloating Primary GI Doctor: Dr. Suzann  HPI:  Patient is a  53  year old female patient with past medical history of dyslipidemia and uterine fibroids, who was referred to me by Diona Perkins, MD on 03/27/24 for a evaluation of colonoscopy, bloating .    04/07/24 seen by cardiology for dyslipidemia.   Interval History A pleasant 53 year old Nigerian female who presents for evaluation of bloating and discuss colon screening colonoscopy  She reports bloating since she was a teenager. If she passes gas or burps she feels better. She notes coffee with creamer seems to  make it worse. Also milk products. She does fine with cheese products.  She reports BM every 1-2 days. Occasional constipation. No blood in stool.  Denies GERD or dysphagia. Appetite good.   Socially drinks. Nonsmoker.   No NSAID use.  Never had EGD/colon.   Surgical history: c section, myomectomy  Patient's family history includes: no colon CA, paternal GF with throat CA?  Wt Readings from Last 3 Encounters:  05/19/24 172 lb 2 oz (78.1 kg)  04/14/24 175 lb 3.2 oz (79.5 kg)  04/07/24 175 lb 12.8 oz (79.7 kg)    Past Medical History:  Diagnosis Date   Fibroids    Kidney stones     Past Surgical History:  Procedure Laterality Date   APPENDECTOMY  1996   CESAREAN SECTION  2010   MYOMECTOMY ABDOMINAL APPROACH  2004    Current Outpatient Medications  Medication Sig Dispense Refill   atorvastatin  (LIPITOR) 40 MG tablet Take 1 tablet (40 mg total) by mouth daily. 90 tablet 3   Bioflavonoid Products (BIOFLEX PO) Take by mouth.     diclofenac  Sodium (VOLTAREN ) 1 % GEL Apply 2 g topically 4 (four) times daily as needed (pain). 150 g 1   Na Sulfate-K Sulfate-Mg Sulfate concentrate (SUPREP) 17.5-3.13-1.6 GM/177ML SOLN Take 1 kit (354 mLs total) by mouth once for 1 dose. 354 mL 0   Probiotic Product (PROBIOTIC BLEND PO) Take by mouth.     No current facility-administered medications  for this visit.    Allergies as of 05/19/2024   (No Known Allergies)    Family History  Problem Relation Age of Onset   Cancer Father        prostate   Cancer Paternal Grandfather        trachea    Review of Systems:    Constitutional: No weight loss, fever, chills, weakness or fatigue HEENT: Eyes: No change in vision               Ears, Nose, Throat:  No change in hearing or congestion Skin: No rash or itching Cardiovascular: No chest pain, chest pressure or palpitations   Respiratory: No SOB or cough Gastrointestinal: See HPI and otherwise negative Genitourinary: No dysuria or change in urinary frequency Neurological: No headache, dizziness or syncope Musculoskeletal: No new muscle or joint pain Hematologic: No bleeding or bruising Psychiatric: No history of depression or anxiety    Physical Exam:  Vital signs: BP 110/60   Pulse 75   Ht 5' 3 (1.6 m)   Wt 172 lb 2 oz (78.1 kg)   BMI 30.49 kg/m   Constitutional:   Pleasant  female appears to be in NAD, Well developed, Well nourished, alert and cooperative Eyes:   PEERL, EOMI. No icterus. Conjunctiva pink. Neck:  Supple Throat: Oral cavity and pharynx without inflammation, swelling or lesion.  Respiratory: Respirations even and unlabored. Lungs clear to  auscultation bilaterally.   No wheezes, crackles, or rhonchi.  Cardiovascular: Normal S1, S2. Regular rate and rhythm. No peripheral edema, cyanosis or pallor.  Gastrointestinal:  Soft, nondistended, nontender. No rebound or guarding. Normal bowel sounds. No appreciable masses or hepatomegaly. Rectal:  Not performed.  Msk:  Symmetrical without gross deformities. Without edema, no deformity or joint abnormality.  Neurologic:  Alert and  oriented x4;  grossly normal neurologically.  Skin:   Dry and intact without significant lesions or rashes.  RELEVANT LABS AND IMAGING: CBC    Latest Ref Rng & Units 11/26/2014   11:16 AM  CBC  WBC 4.0 - 10.5 K/uL 4.9   Hemoglobin  12.0 - 15.0 g/dL 89.0   Hematocrit 63.9 - 46.0 % 34.6   Platelets 150 - 400 K/uL 320      CMP     Latest Ref Rng & Units 03/09/2024    4:18 PM  CMP  Glucose 70 - 99 mg/dL 77   BUN 6 - 24 mg/dL 12   Creatinine 9.42 - 1.00 mg/dL 9.33   Sodium 865 - 855 mmol/L 141   Potassium 3.5 - 5.2 mmol/L 4.0   Chloride 96 - 106 mmol/L 104   CO2 20 - 29 mmol/L 24   Calcium  8.7 - 10.2 mg/dL 9.2      Assessment: Encounter Diagnoses  Name Primary?   Special screening for malignant neoplasms, colon Yes   Bloating     A pleasant 53 year old Nigerian female who presents for evaluation of bloating and discuss colon screening colonoscopy. We discussed dietary modifications and recommended low fodmap diet. Can sample IBgard with meals to see if this helps. Will reevaluate after colonoscopy. Will scheduled colonoscopy for surveillance screening purposes with Dr. Suzann in [facility].   Plan: -Recommend low fod map diet  -OTC Ibgard 2 capsules  -Switch milk products for nondairy as trial -Schedule for a colonoscopy in [facility] with Dr. Suzann. The risks and benefits of colonoscopy with possible polypectomy / biopsies were discussed and the patient agrees to proceed.   Thank you for the courtesy of this consult. Please call me with any questions or concerns.   Kevion Fatheree, FNP-C [facility] 05/19/2024, 10:08 AM  Cc: Diona Perkins, MD  I have reviewed the clinic note as outlined by Cathryne Beal, NP and agree with the assessment, plan and medical decision making.  Ms. Nissan is a 53 year old female at average risk for colorectal cancer screening who is due for colonoscopy.  No family history of colon cancer.  She is appropriate for colonoscopy at [facility].  She has symptoms of bloating which I agree sound consistent with IBS/dietary intolerance and can be managed with IBgard and dietary modification.  Inocente Suzann, MD  "

## 2024-05-19 NOTE — Patient Instructions (Addendum)
 Bloating Recommend Low fodmap diet  May consider switching regular milk and cream to non dairy   OTC lactaid can be used if consuming dairy  Samples OTC Ibgard 2 capsules with meals, can purchase at store if helpful  You have been scheduled for a colonoscopy. Please follow written instructions given to you at your visit today.   If you use inhalers (even only as needed), please bring them with you on the day of your procedure.  DO NOT TAKE 7 DAYS PRIOR TO TEST- Trulicity (dulaglutide) Ozempic, Wegovy (semaglutide) Mounjaro, Zepbound (tirzepatide) Bydureon Bcise (exanatide extended release)  DO NOT TAKE 1 DAY PRIOR TO YOUR TEST Rybelsus (semaglutide) Adlyxin (lixisenatide) Victoza (liraglutide) Byetta (exanatide) ___________________________________________________________________________  Due to recent changes in healthcare laws, you may see the results of your imaging and laboratory studies on MyChart before your provider has had a chance to review them.  We understand that in some cases there may be results that are confusing or concerning to you. Not all laboratory results come back in the same time frame and the provider may be waiting for multiple results in order to interpret others.  Please give us  48 hours in order for your provider to thoroughly review all the results before contacting the office for clarification of your results.   _______________________________________________________  If your blood pressure at your visit was 140/90 or greater, please contact your primary care physician to follow up on this.  _______________________________________________________  If you are age 55 or older, your body mass index should be between 23-30. Your Body mass index is 30.49 kg/m. If this is out of the aforementioned range listed, please consider follow up with your Primary Care Provider.  If you are age 42 or younger, your body mass index should be between 19-25. Your Body  mass index is 30.49 kg/m. If this is out of the aformentioned range listed, please consider follow up with your Primary Care Provider.   ________________________________________________________  The [facility] providers would like to encourage you to use MYCHART to communicate with providers for non-urgent requests or questions.  Due to long hold times on the telephone, sending your provider a message by [facility] may be a faster and more efficient way to get a response.  Please allow 48 business hours for a response.  Please remember that this is for non-urgent requests.  _______________________________________________________  [facility] is using a team-based approach to care.  Your team is made up of your doctor and two to three APPS. Our APPS (Nurse Practitioners and Physician Assistants) work with your physician to ensure care continuity for you. They are fully qualified to address your health concerns and develop a treatment plan. They communicate directly with your gastroenterologist to care for you. Seeing the Advanced Practice Practitioners on your physician's team can help you by facilitating care more promptly, often allowing for earlier appointments, access to diagnostic testing, procedures, and other specialty referrals.   Thank you for trusting me with your gastrointestinal care. Deanna May, FNP-C

## 2024-05-21 NOTE — Therapy (Signed)
 " OUTPATIENT PHYSICAL THERAPY LOWER EXTREMITY   Patient Name: Whitney Johns MRN: [id number] DOB:11-18-1970, 53 y.o., female Today's Date: 05/22/2024  END OF SESSION:  PT End of Session - 05/22/24 0912     Visit Number 6    Number of Visits 13    Date for Recertification  05/27/24    PT Start Time 0845    PT Stop Time 0923    PT Time Calculation (min) 38 min    Activity Tolerance Patient tolerated treatment well    Behavior During Therapy [facility] for tasks assessed/performed               Past Medical History:  Diagnosis Date   Fibroids    Kidney stones    Past Surgical History:  Procedure Laterality Date   APPENDECTOMY  1996   CESAREAN SECTION  2010   MYOMECTOMY ABDOMINAL APPROACH  2004   Patient Active Problem List   Diagnosis Date Noted   Cataracta 03/11/2024   Chronic low back pain without sciatica 03/11/2024   GERD (gastroesophageal reflux disease) 11/01/2014   Osteoarthritis of right knee 11/01/2014   Fibroid, uterine 11/01/2014    PCP: Diona Perkins, MD   REFERRING PROVIDER:   Orie Milda CROME, MD    REFERRING DIAG:  Diagnosis  M17.0 (ICD-10-CM) - Primary osteoarthritis of both knees    THERAPY DIAG:  Chronic pain of both knees  Stiffness of left knee, not elsewhere classified  Stiffness of right knee, not elsewhere classified  Rationale for Evaluation and Treatment: Rehabilitation  ONSET DATE: chronic  SUBJECTIVE:   SUBJECTIVE STATEMENT: Pt states knees feeling good this morning. PERTINENT HISTORY: ---  PAIN:  Are you having pain? 3/10 in bil knees    PRECAUTIONS: None  RED FLAGS: None   WEIGHT BEARING RESTRICTIONS: No  FALLS:  Has patient fallen in last 6 months? No  LIVING ENVIRONMENT: Lives with: lives with their family Lives in: House/apartment Stairs: No Has following equipment at home: None  OCCUPATION: psychologist, counselling  PLOF: Independent  PATIENT GOALS: decrease pain  NEXT MD VISIT: not  listed  OBJECTIVE:  Note: Objective measures were completed at Evaluation unless otherwise noted.  DIAGNOSTIC FINDINGS: none  PATIENT SURVEYS:  PSFS: THE PATIENT SPECIFIC FUNCTIONAL SCALE  Place score of 0-10 (0 = unable to perform activity and 10 = able to perform activity at the same level as before injury or problem)  Activity Date: 04/10/24 Date:  05/15/24   Stairs  6 8   2. Driving >30 6 8    3.     4.      Total Score 6 8     Total Score = Sum of activity scores/number of activities  Minimally Detectable Change: 3 points (for single activity); 2 points (for average score)  [facility] (nd). The Patient Specific Functional Scale . Retrieved from [email or web address]   COGNITION: Overall cognitive status: Within functional limits for tasks assessed     SENSATION: WFL  EDEMA:  Not tested  MUSCLE LENGTH: Hamstrings: Right/Left moderate restriction   POSTURE: + static lateral tilt bilateral patellas  PALPATION: 1+ tenderness R lateral jt line  LOWER EXTREMITY ROM:  Active/Passive ROM Right eval Left eval  Hip flexion    Hip extension    Hip abduction    Hip adduction    Hip internal rotation    Hip external rotation    Knee flexion 120 122  Knee extension 0 0  Ankle dorsiflexion    Ankle plantarflexion  Ankle inversion    Ankle eversion     (Blank rows = not tested)  LOWER EXTREMITY MMT:  MMT Right eval Left eval 05/15/24 R/L  Hip flexion 4 4 4+ bil  Hip extension     Hip abduction 4- 4 4  Hip adduction 4 4 4+ bil  Hip internal rotation     Hip external rotation     Knee flexion 4+ 4+ 4+ bil  Knee extension 4 4 4+ bil  Ankle dorsiflexion     Ankle plantarflexion     Ankle inversion     Ankle eversion      (Blank rows = not tested)  LOWER EXTREMITY SPECIAL TESTS:  Knee special tests: Bounce negative bilaterally, patellar assessment  FUNCTIONAL TESTS:  5 times sit to  stand: 15 sec  GAIT: Distance walked: [employer] device utilized: None Level of assistance: Complete Independence                                                                                                                               TREATMENT DATE: Bil Knee Pain 05/22/24 TherActivites: Nustep: Level 5 x 7 minutes UE/LE (push/pull, strengthening and ROM) Sit to stand: 5#KB 2x 10  Double Leg Press: 87# 3  x 10   Single Leg Press: 43#   2x10 bil  TherEx: Seated SLR  3 x 10 #2 bil LE Bridges: holding 5 sec 3 x 10  Side lying: clamshells with TB 2 x 10 bil  green Calf stretch: x 3 holding 30 sec on incline board    05/15/24 TherActivites: Nustep: Level 5 x 6 minutes UE/LE (push/pull, strengthening and ROM) Sit to stand: 2lb ball hold 2x 10  Double Leg Press: 87# 3  x 10  Single Leg Press: 43#   2x10 bil  TherEx: Seated SLR  2 x 10 #1.5 bil LE Bridges: holding 5 sec 2 x 10  Side lying: clamshells with TB 2 x 10 bil  green Calf stretch: x 3 holding 30 sec on incline board     05/08/24 TherActivites: Nustep: Level 5 x 6 minutes UE/LE (push/pull, strengthening and ROM) Sit to stand: 2lb ball hold 2x 10  Double Leg Press: 87# 3  x 10  Single Leg Press: 43#   2x10 bil  TherEx: Seated SLR  2 x 10 #1.5 bil LE Bridges: holding 5 sec 2 x 10  Side lying: hip abduction 2 x 10 bil   Calf stretch: x 3 holding 30 sec on incline board       PATIENT EDUCATION:  Education details: HEP Person educated: Patient Education method: Explanation, Demonstration, Tactile cues, and Verbal cues Education comprehension: verbalized understanding, returned demonstration, verbal cues required, and tactile cues required  HOME EXERCISE PROGRAM: Access Code: [email or web address] URL: [email or web address] Date: 04/10/2024 Prepared by: Burnard Meth  Exercises - Active Straight Leg Raise with Quad Set  - 2 x daily - 7 x weekly - 2  sets - 10 reps - Sidelying Hip Abduction  - 2 x  daily - 7 x weekly - 2 sets - 10 reps - Hooklying Hamstring Stretch with Strap  - 2 x daily - 7 x weekly - 1 sets - 3 reps - 25 hold  ASSESSMENT:  CLINICAL IMPRESSION: Pt able to tolerate increased sets with there ex- demonstrating strength increased.  OBJECTIVE IMPAIRMENTS: decreased strength, impaired flexibility, and pain.   ACTIVITY LIMITATIONS: standing, squatting, and stairs  PARTICIPATION LIMITATIONS: driving, community activity, and occupation  PERSONAL FACTORS: None---are also affecting patient's functional outcome.   REHAB POTENTIAL: Good  CLINICAL DECISION MAKING: Stable/uncomplicated  EVALUATION COMPLEXITY: Moderate   GOALS: Goals reviewed with patient? Yes  SHORT TERM GOALS: Target date: 05/03/2024  3 weeks  MET    Pt to be independent with HEP. Baseline: Goal status: MET 05/01/24  LONG TERM GOALS: Target date: 05/27/2024  6.5 weeks    Increase strength by 1/2 to 1 full grade in affected LE musculature. Baseline:  Goal status: ONGOING 05/15/24  2.  Decrease daily pain to 2/10 or less and max pain to 5/10 or less.  Baseline: 2>7/10. Goal status: ONGOING 05/15/24  3.  Improve flexibility of lower extremity musculature to minimal restriction. Baseline:  Goal status: ONGOING 05/15/24  4.  Patient to be independent with self progressive HEP by time of discharge. Baseline:  Goal status: ONGOING 05/15/24  5.  Increase score of PSFS by 2 points for measurable difference. Baseline:  Goal status: MET 05/15/24   PLAN:  PT FREQUENCY: 1-2x/week  PT DURATION: 6 weeks  PLANNED INTERVENTIONS: [id number]- PT Re-evaluation, 97110-Therapeutic exercises, 97530- Therapeutic activity, 97112- Neuromuscular re-education, 97535- Self Care, [id number]- Manual therapy, [phone]- Aquatic Therapy, [id number]- Electrical stimulation (unattended), Patient/Family education, Stair training, Joint mobilization, Cryotherapy, and Moist heat  PLAN FOR NEXT SESSION:Prepare for DC.  Burnard Meth,  PT 05/22/2024  9:13 AM    "

## 2024-05-22 ENCOUNTER — Ambulatory Visit

## 2024-05-22 DIAGNOSIS — G8929 Other chronic pain: Secondary | ICD-10-CM

## 2024-05-22 DIAGNOSIS — M25661 Stiffness of right knee, not elsewhere classified: Secondary | ICD-10-CM

## 2024-05-22 DIAGNOSIS — M25562 Pain in left knee: Secondary | ICD-10-CM

## 2024-05-22 DIAGNOSIS — M25561 Pain in right knee: Secondary | ICD-10-CM

## 2024-05-22 DIAGNOSIS — M25662 Stiffness of left knee, not elsewhere classified: Secondary | ICD-10-CM | POA: Diagnosis not present

## 2024-05-24 NOTE — Therapy (Signed)
 " OUTPATIENT PHYSICAL THERAPY LOWER EXTREMITY TREATMENT/DISCHARGE NOTE  Patient Name: Whitney Johns MRN: [id number] DOB:Sep 19, 1970, 53 y.o., female Today's Date: 05/25/2024  END OF SESSION:  PT End of Session - 05/25/24 1216     Visit Number 7    Number of Visits 13    Date for Recertification  05/27/24    PT Start Time 1145    PT Stop Time 1215    PT Time Calculation (min) 30 min    Activity Tolerance Patient tolerated treatment well    Behavior During Therapy [facility] for tasks assessed/performed              PHYSICAL THERAPY DISCHARGE SUMMARY  Visits from Start of Care: 7  Current functional level related to goals / functional outcomes: See below   Remaining deficits: none   Education / Equipment: HEP   Patient agrees to discharge. Patient goals were met. Patient is being discharged due to meeting the stated rehab goals.   Past Medical History:  Diagnosis Date   Fibroids    Kidney stones    Past Surgical History:  Procedure Laterality Date   APPENDECTOMY  1996   CESAREAN SECTION  2010   MYOMECTOMY ABDOMINAL APPROACH  2004   Patient Active Problem List   Diagnosis Date Noted   Cataracta 03/11/2024   Chronic low back pain without sciatica 03/11/2024   GERD (gastroesophageal reflux disease) 11/01/2014   Osteoarthritis of right knee 11/01/2014   Fibroid, uterine 11/01/2014    PCP: Diona Perkins, MD   REFERRING PROVIDER:   Orie Milda CROME, MD    REFERRING DIAG:  Diagnosis  M17.0 (ICD-10-CM) - Primary osteoarthritis of both knees    THERAPY DIAG:  Chronic pain of both knees  Stiffness of left knee, not elsewhere classified  Stiffness of right knee, not elsewhere classified  Rationale for Evaluation and Treatment: Rehabilitation  ONSET DATE: chronic  SUBJECTIVE:   SUBJECTIVE STATEMENT: Pt states knees feeling great.  PERTINENT HISTORY: ---  PAIN:  Are you having pain? 0/10 in bil knees    PRECAUTIONS: None  RED  FLAGS: None   WEIGHT BEARING RESTRICTIONS: No  FALLS:  Has patient fallen in last 6 months? No  LIVING ENVIRONMENT: Lives with: lives with their family Lives in: House/apartment Stairs: No Has following equipment at home: None  OCCUPATION: psychologist, counselling  PLOF: Independent  PATIENT GOALS: decrease pain  NEXT MD VISIT: not listed  OBJECTIVE:  Note: Objective measures were completed at Evaluation unless otherwise noted.  DIAGNOSTIC FINDINGS: none  PATIENT SURVEYS:  PSFS: THE PATIENT SPECIFIC FUNCTIONAL SCALE  Place score of 0-10 (0 = unable to perform activity and 10 = able to perform activity at the same level as before injury or problem)  Activity Date: 04/10/24 Date:  05/15/24   Stairs  6 8   2. Driving >30 6 8    3.     4.      Total Score 6 8     Total Score = Sum of activity scores/number of activities  Minimally Detectable Change: 3 points (for single activity); 2 points (for average score)  [employer] (nd). The Patient Specific Functional Scale . Retrieved from Skateoasis.com.pt   COGNITION: Overall cognitive status: Within functional limits for tasks assessed     SENSATION: WFL  EDEMA:  Not tested  MUSCLE LENGTH: Hamstrings: Right/Left moderate restriction   POSTURE: + static lateral tilt bilateral patellas  PALPATION: 1+ tenderness R lateral jt line  LOWER EXTREMITY ROM:  Active/Passive ROM Right eval Left eval  Hip flexion    Hip extension    Hip abduction    Hip adduction    Hip internal rotation    Hip external rotation    Knee flexion 120 122  Knee extension 0 0  Ankle dorsiflexion    Ankle plantarflexion    Ankle inversion    Ankle eversion     (Blank rows = not tested)  LOWER EXTREMITY MMT:  MMT Right eval Left eval 05/15/24 R/L 05/25/24  Hip flexion 4 4 4+ bil   Hip extension      Hip abduction 4- 4 4 4+  Hip adduction 4 4 4+ bil   Hip  internal rotation      Hip external rotation      Knee flexion 4+ 4+ 4+ bil   Knee extension 4 4 4+ bil 5- bil  Ankle dorsiflexion      Ankle plantarflexion      Ankle inversion      Ankle eversion       (Blank rows = not tested)  LOWER EXTREMITY SPECIAL TESTS:  Knee special tests: Bounce negative bilaterally, patellar assessment  FUNCTIONAL TESTS:  5 times sit to stand: 13 sec  GAIT: Distance walked: Financial Trader device utilized: None Level of assistance: Complete Independence                                                                                                                               TREATMENT DATE: Bil Knee Pain 05/25/24 TherActivites: Nustep: Level 5 x 5 minutes UE/LE (push/pull, strengthening and ROM) Sit to stand: 5#KB 2x 10  Double Leg Press: 87# 3  x 10   Single Leg Press: 43#   2x10 bil  TherEx: Seated SLR  3 x 10 #2.5 bil LE Seated clamshells with TB 3 x 10 bil  green Calf stretch: x 3 holding 30 sec on incline board   05/22/24 TherActivites: Nustep: Level 5 x 7 minutes UE/LE (push/pull, strengthening and ROM) Sit to stand: 5#KB 2x 10  Double Leg Press: 87# 3  x 10   Single Leg Press: 43#   2x10 bil  TherEx: Seated SLR  3 x 10 #2 bil LE Bridges: holding 5 sec 3 x 10  Side lying: clamshells with TB 2 x 10 bil  green Calf stretch: x 3 holding 30 sec on incline board    05/15/24 TherActivites: Nustep: Level 5 x 6 minutes UE/LE (push/pull, strengthening and ROM) Sit to stand: 2lb ball hold 2x 10  Double Leg Press: 87# 3  x 10  Single Leg Press: 43#   2x10 bil  TherEx: Seated SLR  2 x 10 #1.5 bil LE Bridges: holding 5 sec 2 x 10  Side lying: clamshells with TB 2 x 10 bil  green Calf stretch: x 3 holding 30 sec on incline board     PATIENT EDUCATION:  Education details: HEP Person  educated: Patient Education method: Explanation, Demonstration, Tactile cues, and Verbal cues Education comprehension: verbalized understanding,  returned demonstration, verbal cues required, and tactile cues required  HOME EXERCISE PROGRAM: Access Code: [email or web address] URL: [email or web address] Date: 04/10/2024 Prepared by: Burnard Meth  Exercises - Active Straight Leg Raise with Quad Set  - 2 x daily - 7 x weekly - 2 sets - 10 reps - Sidelying Hip Abduction  - 2 x daily - 7 x weekly - 2 sets - 10 reps - Hooklying Hamstring Stretch with Strap  - 2 x daily - 7 x weekly - 1 sets - 3 reps - 25 hold  ASSESSMENT:  CLINICAL IMPRESSION: STG and LTG completed. OBJECTIVE IMPAIRMENTS: decreased strength, impaired flexibility, and pain.   ACTIVITY LIMITATIONS: standing, squatting, and stairs  PARTICIPATION LIMITATIONS: driving, community activity, and occupation  PERSONAL FACTORS: None---are also affecting patient's functional outcome.   REHAB POTENTIAL: Good  CLINICAL DECISION MAKING: Stable/uncomplicated  EVALUATION COMPLEXITY: Moderate   GOALS: Goals reviewed with patient? Yes  SHORT TERM GOALS: Target date: 05/03/2024  3 weeks  MET    Pt to be independent with HEP. Baseline: Goal status: MET 05/01/24  LONG TERM GOALS: Target date: 05/27/2024  6.5 weeks MET    Increase strength by 1/2 to 1 full grade in affected LE musculature. Baseline:  Goal status: MET 05/25/24  2.  Decrease daily pain to 2/10 or less and max pain to 5/10 or less.  Baseline: 2>7/10. Goal status: MET 05/25/24  3.  Improve flexibility of lower extremity musculature to minimal restriction. Baseline:  Goal status: MET 05/25/24  4.  Patient to be independent with self progressive HEP by time of discharge. Baseline:  Goal status: MET 05/25/24  5.  Increase score of PSFS by 2 points for measurable difference. Baseline:  Goal status: MET 05/15/24   PLAN:  PT FREQUENCY: 1-2x/week  PT DURATION: 6 weeks  PLANNED INTERVENTIONS: 97164- PT Re-evaluation, 97110-Therapeutic exercises, 97530- Therapeutic activity, 97112- Neuromuscular  re-education, 97535- Self Care, 02859- Manual therapy, [phone]- Aquatic Therapy, H9716- Electrical stimulation (unattended), Patient/Family education, Stair training, Joint mobilization, Cryotherapy, and Moist heat  PLAN FOR NEXT SESSION:  DC to HEP.  Burnard Meth, PT 05/25/2024  12:17 PM    "

## 2024-05-25 ENCOUNTER — Ambulatory Visit: Payer: Self-pay

## 2024-05-25 DIAGNOSIS — M25561 Pain in right knee: Secondary | ICD-10-CM

## 2024-05-25 DIAGNOSIS — M25661 Stiffness of right knee, not elsewhere classified: Secondary | ICD-10-CM | POA: Diagnosis not present

## 2024-05-25 DIAGNOSIS — M25562 Pain in left knee: Secondary | ICD-10-CM

## 2024-05-25 DIAGNOSIS — G8929 Other chronic pain: Secondary | ICD-10-CM | POA: Diagnosis not present

## 2024-05-25 DIAGNOSIS — M25662 Stiffness of left knee, not elsewhere classified: Secondary | ICD-10-CM

## 2024-05-26 ENCOUNTER — Encounter: Payer: Self-pay | Admitting: Internal Medicine

## 2024-06-01 ENCOUNTER — Encounter: Payer: Self-pay | Admitting: Pediatrics

## 2024-06-07 NOTE — Progress Notes (Unsigned)
 [facility] History and Physical   Primary Care Physician:  Diona Perkins, MD   Reason for Procedure:  Colorectal cancer screening  Plan:    Screening colonoscopy   The patient was provided an opportunity to ask questions and all were answered. The patient agreed with the plan.   HPI: Whitney Johns is a 54 y.o. female undergoing screening colonoscopy for colorectal cancer screening.  This is the patient's first colonoscopy.  No family history of colorectal cancer or polyps.  Patient was seen in clinic for symptoms of bloating which seem to be related to dietary factors and/or IBS.  No other alarm features.   Past Medical History:  Diagnosis Date   Fibroids    Kidney stones     Past Surgical History:  Procedure Laterality Date   APPENDECTOMY  1996   CESAREAN SECTION  2010   MYOMECTOMY ABDOMINAL APPROACH  2004    Prior to Admission medications  Medication Sig Start Date End Date Taking? Authorizing Provider  atorvastatin  (LIPITOR) 40 MG tablet Take 1 tablet (40 mg total) by mouth daily. 03/12/24   Diona Perkins, MD  Bioflavonoid Products (BIOFLEX PO) Take by mouth.    [provider]  diclofenac  Sodium (VOLTAREN ) 1 % GEL Apply 2 g topically 4 (four) times daily as needed (pain). 03/09/24   Diona Perkins, MD  Probiotic Product (PROBIOTIC BLEND PO) Take by mouth.    [provider]    Current Outpatient Medications  Medication Sig Dispense Refill   atorvastatin  (LIPITOR) 40 MG tablet Take 1 tablet (40 mg total) by mouth daily. 90 tablet 3   Bioflavonoid Products (BIOFLEX PO) Take by mouth.     diclofenac  Sodium (VOLTAREN ) 1 % GEL Apply 2 g topically 4 (four) times daily as needed (pain). 150 g 1   Probiotic Product (PROBIOTIC BLEND PO) Take by mouth.     No current facility-administered medications for this visit.    Allergies as of 06/08/2024   (No Known Allergies)    Family History  Problem Relation Age of Onset   Cancer Father         prostate   Cancer Paternal Grandfather        trachea    Social History   Socioeconomic History   Marital status: Married    Spouse name: Not on file   Number of children: 2   Years of education: Not on file   Highest education level: Not on file  Occupational History   Not on file  Tobacco Use   Smoking status: Never   Smokeless tobacco: Never  Vaping Use   Vaping status: Never Used  Substance and Sexual Activity   Alcohol use: No    Alcohol/week: 0.0 standard drinks of alcohol    Comment: once per month (1 glass of wine)   Drug use: No   Sexual activity: Yes    Partners: Male    Birth control/protection: None  Other Topics Concern   Not on file  Social History Narrative   Not on file   Social Drivers of Health   Tobacco Use: Low Risk (05/25/2024)   Patient History    Smoking Tobacco Use: Never    Smokeless Tobacco Use: Never    Passive Exposure: Not on file  Financial Resource Strain: Not on file  Food Insecurity: Not on file  Transportation Needs: Not on file  Physical Activity: Not on file  Stress: Not on file  Social Connections: Not on file  Intimate Partner Violence:  Not on file  Depression (PHQ2-9): Medium Risk (04/14/2024)   Depression (PHQ2-9)    PHQ-2 Score: 5  Alcohol Screen: Not on file  Housing: Not on file  Utilities: Not on file  Health Literacy: Not on file    Review of Systems:  All other review of systems negative except as mentioned in the HPI.  Physical Exam: Vital signs There were no vitals taken for this visit.  General:   Alert,  Well-developed, well-nourished, pleasant and cooperative in NAD Airway:  Mallampati  Lungs:  Clear throughout to auscultation.   Heart:  Regular rate and rhythm; no murmurs, clicks, rubs,  or gallops. Abdomen:  Soft, nontender and nondistended. Normal bowel sounds.   Neuro/Psych:  Normal mood and affect. A and O x 3  Inocente Hausen, MD [facility]

## 2024-06-08 ENCOUNTER — Ambulatory Visit: Admitting: Pediatrics

## 2024-06-08 ENCOUNTER — Encounter: Payer: Self-pay | Admitting: Pediatrics

## 2024-06-08 VITALS — BP 114/71 | HR 73 | Temp 97.5°F | Resp 21 | Ht 63.0 in | Wt 172.2 lb

## 2024-06-08 DIAGNOSIS — K644 Residual hemorrhoidal skin tags: Secondary | ICD-10-CM

## 2024-06-08 DIAGNOSIS — Z1211 Encounter for screening for malignant neoplasm of colon: Secondary | ICD-10-CM | POA: Diagnosis present

## 2024-06-08 DIAGNOSIS — D122 Benign neoplasm of ascending colon: Secondary | ICD-10-CM

## 2024-06-08 DIAGNOSIS — K648 Other hemorrhoids: Secondary | ICD-10-CM | POA: Diagnosis not present

## 2024-06-08 MED ORDER — SODIUM CHLORIDE 0.9 % IV SOLN: 500.0000 mL | Freq: Once | INTRAVENOUS | Status: DC

## 2024-06-08 NOTE — Progress Notes (Signed)
 Called to room to assist during endoscopic procedure.  Patient ID and intended procedure confirmed with present staff. Received instructions for my participation in the procedure from the performing physician.

## 2024-06-08 NOTE — Patient Instructions (Addendum)
 Await pathology results.                           - Repeat colonoscopy for surveillance based on                            pathology results.                           - The findings and recommendations were discussed                            with the patient's family.                           - Patient has a contact number available for                            emergencies. The signs and symptoms of potential                            delayed complications were discussed with the                            patient. Return to normal activities tomorrow.                            Written discharge instructions were provided to the                            patient.  Handout on polyps given.   OU HAD AN ENDOSCOPIC PROCEDURE TODAY AT THE [facility]:   Refer to the procedure report that was given to you for any specific questions about what was found during the examination.  If the procedure report does not answer your questions, please call your gastroenterologist to clarify.  If you requested that your care partner not be given the details of your procedure findings, then the procedure report has been included in a sealed envelope for you to review at your convenience later.  YOU SHOULD EXPECT: Some feelings of bloating in the abdomen. Passage of more gas than usual.  Walking can help get rid of the air that was put into your GI tract during the procedure and reduce the bloating. If you had a lower endoscopy (such as a colonoscopy or flexible sigmoidoscopy) you may notice spotting of blood in your stool or on the toilet paper. If you underwent a bowel prep for your procedure, you may not have a normal bowel movement for a few days.  Please Note:  You might notice some irritation and congestion in your nose or some drainage.  This is from the oxygen used during your procedure.  There is no need for concern and it should clear up in a day or so.  SYMPTOMS TO REPORT  IMMEDIATELY:  Following lower endoscopy (colonoscopy or flexible sigmoidoscopy):  Excessive amounts of blood in the stool  Significant tenderness or worsening of abdominal pains  Swelling of the abdomen that is new, acute  Fever of 100F or higher    For urgent  or emergent issues, a gastroenterologist can be reached at any hour by calling [phone]. Do not use MyChart messaging for urgent concerns.    DIET:  We do recommend a small meal at first, but then you may proceed to your regular diet.  Drink plenty of fluids but you should avoid alcoholic beverages for 24 hours.  ACTIVITY:  You should plan to take it easy for the rest of today and you should NOT DRIVE or use heavy machinery until tomorrow (because of the sedation medicines used during the test).    FOLLOW UP: Our staff will call the number listed on your records the next business day following your procedure.  We will call around 7:15- 8:00 am to check on you and address any questions or concerns that you may have regarding the information given to you following your procedure. If we do not reach you, we will leave a message.     If any biopsies were taken you will be contacted by phone or by letter within the next 1-3 weeks.  Please call us  at [phone] if you have not heard about the biopsies in 3 weeks.    SIGNATURES/CONFIDENTIALITY: You and/or your care partner have signed paperwork which will be entered into your electronic medical record.  These signatures attest to the fact that that the information above on your After Visit Summary has been reviewed and is understood.  Full responsibility of the confidentiality of this discharge information lies with you and/or your care-partner.

## 2024-06-08 NOTE — Op Note (Signed)
 [facility] Patient Name: Whitney Johns Procedure Date: 06/08/2024 1:16 PM MRN: [id number] Endoscopist: Inocente Hausen , MD, [id number] Age: 54 Referring MD:  Date of Birth: 21-Sep-1970 Gender: Female Account #: [id number] Procedure:                Colonoscopy Indications:              Screening for colorectal malignant neoplasm, This                            is the patient's first colonoscopy Medicines:                Monitored Anesthesia Care Procedure:                Pre-Anesthesia Assessment:                           - Prior to the procedure, a History and Physical                            was performed, and patient medications and                            allergies were reviewed. The patient's tolerance of                            previous anesthesia was also reviewed. The risks                            and benefits of the procedure and the sedation                            options and risks were discussed with the patient.                            All questions were answered, and informed consent                            was obtained. Prior Anticoagulants: The patient has                            taken no anticoagulant or antiplatelet agents. ASA                            Grade Assessment: II - A patient with mild systemic                            disease. After reviewing the risks and benefits,                            the patient was deemed in satisfactory condition to                            undergo the procedure.  After obtaining informed consent, the colonoscope                            was passed under direct vision. Throughout the                            procedure, the patient's blood pressure, pulse, and                            oxygen saturations were monitored continuously. The                            Olympus CF-HQ190L ([id number]) Colonoscope was                            introduced through the  anus and advanced to the                            terminal ileum. The colonoscopy was performed                            without difficulty. The patient tolerated the                            procedure well. The quality of the bowel                            preparation was good. The terminal ileum, ileocecal                            valve, appendiceal orifice, and rectum were                            photographed. Scope In: 1:22:15 PM Scope Out: 1:35:55 PM Scope Withdrawal Time: 0 hours 10 minutes 29 seconds  Total Procedure Duration: 0 hours 13 minutes 40 seconds  Findings:                 Skin tags were found on perianal exam.                           The digital rectal exam was normal. Pertinent                            negatives include normal sphincter tone and no                            palpable rectal lesions.                           A 6 mm polyp was found in the ascending colon. The                            polyp was sessile. The polyp was removed with a  cold snare. Resection and retrieval were complete.                           The terminal ileum appeared normal.                           Internal hemorrhoids were found during retroflexion. Complications:            No immediate complications. Estimated blood loss:                            Minimal. Estimated Blood Loss:     Estimated blood loss was minimal. Impression:               - Perianal skin tags found on perianal exam.                           - One 6 mm polyp in the ascending colon, removed                            with a cold snare. Resected and retrieved.                           - The examined portion of the ileum was normal.                           - Internal hemorrhoids. Recommendation:           - Discharge patient to home (ambulatory).                           - Await pathology results.                           - Repeat colonoscopy for surveillance  based on                            pathology results.                           - The findings and recommendations were discussed                            with the patient's family.                           - Patient has a contact number available for                            emergencies. The signs and symptoms of potential                            delayed complications were discussed with the                            patient. Return to normal activities tomorrow.  Written discharge instructions were provided to the                            patient. Inocente Hausen, MD 06/08/2024 1:39:12 PM This report has been signed electronically.

## 2024-06-08 NOTE — Progress Notes (Signed)
 Pt's states no medical or surgical changes since previsit or office visit.

## 2024-06-08 NOTE — Progress Notes (Signed)
 Vss nad trans to pacu

## 2024-06-09 ENCOUNTER — Telehealth: Payer: Self-pay

## 2024-06-09 NOTE — Telephone Encounter (Signed)
 No answer after follow up call. Voice message left.

## 2024-06-11 LAB — SURGICAL PATHOLOGY

## 2024-06-12 ENCOUNTER — Ambulatory Visit: Payer: Self-pay | Admitting: Pediatrics
